# Patient Record
Sex: Male | Born: 1960 | Race: White | Hispanic: No | Marital: Married | Smoking: Never smoker
Health system: Southern US, Community
[De-identification: ages and names within clinical notes are randomized; demographics above are authoritative.]

## PROBLEM LIST (undated history)

## (undated) DIAGNOSIS — I240 Acute coronary thrombosis not resulting in myocardial infarction: Secondary | ICD-10-CM

## (undated) DIAGNOSIS — I10 Essential (primary) hypertension: Secondary | ICD-10-CM

## (undated) DIAGNOSIS — E119 Type 2 diabetes mellitus without complications: Secondary | ICD-10-CM

## (undated) DIAGNOSIS — G629 Polyneuropathy, unspecified: Secondary | ICD-10-CM

## (undated) DIAGNOSIS — F4310 Post-traumatic stress disorder, unspecified: Secondary | ICD-10-CM

## (undated) DIAGNOSIS — I639 Cerebral infarction, unspecified: Secondary | ICD-10-CM

## (undated) HISTORY — PX: SHOULDER SURGERY: SHX246

---

## 1998-08-06 ENCOUNTER — Encounter: Payer: Self-pay | Admitting: Emergency Medicine

## 1998-08-06 ENCOUNTER — Emergency Department (HOSPITAL_COMMUNITY): Admission: EM | Admit: 1998-08-06 | Discharge: 1998-08-06 | Payer: Self-pay | Admitting: Emergency Medicine

## 2006-03-03 ENCOUNTER — Emergency Department: Payer: Self-pay | Admitting: Emergency Medicine

## 2008-12-17 ENCOUNTER — Emergency Department: Payer: Self-pay

## 2009-12-01 ENCOUNTER — Emergency Department (HOSPITAL_COMMUNITY): Admission: EM | Admit: 2009-12-01 | Discharge: 2009-12-02 | Payer: Self-pay | Admitting: Emergency Medicine

## 2011-03-05 LAB — DIFFERENTIAL
Basophils Absolute: 0 10*3/uL (ref 0.0–0.1)
Eosinophils Relative: 0 % (ref 0–5)
Lymphocytes Relative: 5 % — ABNORMAL LOW (ref 12–46)
Lymphs Abs: 1 10*3/uL (ref 0.7–4.0)
Monocytes Absolute: 0.4 10*3/uL (ref 0.1–1.0)
Monocytes Relative: 2 % — ABNORMAL LOW (ref 3–12)
Neutro Abs: 17.4 10*3/uL — ABNORMAL HIGH (ref 1.7–7.7)

## 2011-03-05 LAB — RAPID URINE DRUG SCREEN, HOSP PERFORMED
Amphetamines: NOT DETECTED
Barbiturates: NOT DETECTED
Benzodiazepines: NOT DETECTED
Cocaine: POSITIVE — AB
Opiates: NOT DETECTED
Tetrahydrocannabinol: NOT DETECTED

## 2011-03-05 LAB — URINALYSIS, ROUTINE W REFLEX MICROSCOPIC
Bilirubin Urine: NEGATIVE
Glucose, UA: >1000 mg/dL — AB
Hgb urine dipstick: NEGATIVE
Leukocytes, UA: NEGATIVE
Nitrite: NEGATIVE
Protein, ur: NEGATIVE mg/dL
Specific Gravity, Urine: 1.016 (ref 1.005–1.030)
Urobilinogen, UA: 0.2 mg/dL (ref 0.0–1.0)
pH: 5.5 (ref 5.0–8.0)

## 2011-03-05 LAB — TRICYCLICS SCREEN, URINE: TCA Scrn: NOT DETECTED

## 2011-03-05 LAB — URINE MICROSCOPIC-ADD ON

## 2011-03-05 LAB — CBC
HCT: 38.6 % — ABNORMAL LOW (ref 39.0–52.0)
MCV: 86.8 fL (ref 78.0–100.0)
Platelets: 254 10*3/uL (ref 150–400)
RDW: 12.1 % (ref 11.5–15.5)
WBC: 18.9 10*3/uL — ABNORMAL HIGH (ref 4.0–10.5)

## 2011-03-05 LAB — COMPREHENSIVE METABOLIC PANEL
AST: 21 U/L (ref 0–37)
Albumin: 3.6 g/dL (ref 3.5–5.2)
BUN: 8 mg/dL (ref 6–23)
Calcium: 8 mg/dL — ABNORMAL LOW (ref 8.4–10.5)
Chloride: 105 mEq/L (ref 96–112)
Creatinine, Ser: 1.02 mg/dL (ref 0.4–1.5)
GFR calc Af Amer: >60 mL/min (ref >60)
Total Protein: 6.7 g/dL (ref 6.0–8.3)

## 2013-05-03 ENCOUNTER — Inpatient Hospital Stay: Payer: Self-pay | Admitting: Internal Medicine

## 2013-05-03 LAB — BASIC METABOLIC PANEL
BUN: 14 mg/dL (ref 7–18)
Chloride: 100 mmol/L (ref 98–107)
Co2: 27 mmol/L (ref 21–32)
EGFR (African American): >60
EGFR (Non-African Amer.): 59 — ABNORMAL LOW
Glucose: 224 mg/dL — ABNORMAL HIGH (ref 65–99)
Osmolality: 278 (ref 275–301)
Potassium: 3.8 mmol/L (ref 3.5–5.1)
Sodium: 135 mmol/L — ABNORMAL LOW (ref 136–145)

## 2013-05-03 LAB — CBC
MCH: 30 pg (ref 26.0–34.0)
MCHC: 34.9 g/dL (ref 32.0–36.0)
Platelet: 335 10*3/uL (ref 150–440)
RDW: 13.2 % (ref 11.5–14.5)

## 2013-05-03 LAB — TROPONIN I: Troponin-I: 0.04 ng/mL

## 2013-05-03 LAB — CK TOTAL AND CKMB (NOT AT ARMC): CK, Total: 305 U/L — ABNORMAL HIGH (ref 35–232)

## 2013-05-04 DIAGNOSIS — R079 Chest pain, unspecified: Secondary | ICD-10-CM

## 2013-05-04 DIAGNOSIS — I059 Rheumatic mitral valve disease, unspecified: Secondary | ICD-10-CM

## 2013-05-04 LAB — COMPREHENSIVE METABOLIC PANEL
BUN: 24 mg/dL — ABNORMAL HIGH (ref 7–18)
Bilirubin,Total: 0.4 mg/dL (ref 0.2–1.0)
Calcium, Total: 8.6 mg/dL (ref 8.5–10.1)
Chloride: 104 mmol/L (ref 98–107)
EGFR (Non-African Amer.): 40 — ABNORMAL LOW
Glucose: 191 mg/dL — ABNORMAL HIGH (ref 65–99)
Osmolality: 283 (ref 275–301)
SGOT(AST): 19 U/L (ref 15–37)
Sodium: 137 mmol/L (ref 136–145)
Total Protein: 7.1 g/dL (ref 6.4–8.2)

## 2013-05-04 LAB — URINALYSIS, COMPLETE
Bilirubin,UR: NEGATIVE
Ketone: NEGATIVE
Leukocyte Esterase: NEGATIVE
Ph: 5 (ref 4.5–8.0)
Protein: 100

## 2013-05-04 LAB — CBC WITH DIFFERENTIAL/PLATELET
Eosinophil #: 0.7 10*3/uL (ref 0.0–0.7)
HCT: 42.7 % (ref 40.0–52.0)
HGB: 15 g/dL (ref 13.0–18.0)
Lymphocyte %: 16.9 %
MCHC: 35 g/dL (ref 32.0–36.0)
MCV: 86 fL (ref 80–100)
Monocyte %: 5.1 %
Neutrophil #: 8.6 10*3/uL — ABNORMAL HIGH (ref 1.4–6.5)
Neutrophil %: 71.1 %

## 2013-05-04 LAB — CK TOTAL AND CKMB (NOT AT ARMC)
CK, Total: 261 U/L — ABNORMAL HIGH (ref 35–232)
CK-MB: 6.3 ng/mL — ABNORMAL HIGH (ref 0.5–3.6)
CK-MB: 6.4 ng/mL — ABNORMAL HIGH (ref 0.5–3.6)

## 2013-05-09 LAB — CULTURE, BLOOD (SINGLE)

## 2015-03-25 NOTE — Discharge Summary (Signed)
PATIENT NAME:  Alan Bryant, Alan Bryant MR#:  [id number] DATE OF BIRTH:  1960/12/13  DATE OF ADMISSION:  05/03/2013 DATE OF DISCHARGE:  05/04/2013  PR[facility]IC[facility]  [facility] [place].  CONSULTATIONS:  Cardiology consult by Dr. Mariah MillingGollan.   FINAL DIAGNOSES: 1.  Chest pain, borderline troponin that normalized history of coronary artery disease. 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  2.  Acute renal failure.  3.  Diabetes.  4.  Hypertension.  5.  Hyperlipidemia.   MEDICATIONS ON DISCHARGE: Include: Celexa 20 mg daily, nitroglycerin patch 0.1 mg/hour transdermal film extended-release one patch daily, Ambien 5 mg at bedtime, metoprolol tartrate 25 mg 1 tablet twice a day; do not take if using cocaine, atorvastatin 80 mg 1 tablet at bedtime, Haldol 1 mg at bedtime, morphine 30 mg per 12 hour extended-release 1 tablet every 12 hours, acetaminophen/ hydrocodone 325/5, 1 tablet every 6 hours as needed for pain. Stop lisinopril, stop her Glucophage.   DIET: Low sodium diet, regular consistency.   ACTIVITY: As tolerated.   FOLLOW-UP: With Dr. Mariah MillingGollan in 1 week. [facility] [place]. Recommend checking a BMP at follow-up appointment.   HOSPITAL COURSE: The patient was admitted with chest pain and elevated troponin.   HISTORY OF PRESENT ILLNESS: This is a 54 year old man with history of heart disease, cocaine abuse. He presented with left-sided chest pain radiating to the arm,  abusing cocaine all last night into the morning. He was found to have a slightly elevated troponin and CK-MB. White count was high. He was admitted to the hospital.   LABORATORY AND RADIOLOGICAL DATA DURING THE HOSPITAL COURSE: Included an EKG shows sinus tachycardia, left atrial enlargement.   CK 305, CK-MB 10.7, troponin borderline at 0.06, glucose 224, BUN 14, creatinine 1.37, sodium 135, potassium 3.8, chloride 100, CO2 of 27, calcium 9.7. A white blood cell count 23.7, hemoglobin and hematocrit 17.7 and  50.8, platelet count of 335.   Chest x-ray showed no acute cardiopulmonary disease.   Repeat EKG showed normal sinus rhythm, left atrial enlargement, nonspecific ST-T wave changes.   Blood cultures were negative. Oxygen saturation 92% on room air.   Urinalysis: 2+ blood, otherwise negative, greater than 500 mg/dL glucose. Creatinine elevated to 1.9 on June. White count down to 12.1.   Echocardiogram showed an ejection fraction of 55% to 60%, impaired relaxation pattern of left ventricular diastolic filling,   Repeat chest x-ray: No acute cardiopulmonary disease.   Next troponin negative at 0.04, next troponin negative at 0.04, next troponin negative at 0.02.   HOSPITAL COURSE PER PROBLEM LIST:  1.  For the patient's chest pain and borderline troponin that normalized, history of coronary artery disease this is likely cocaine induced vasospasm. The patient was seen in consultation by cardiology Dr. Mariah MillingGollan, who did not feel that any further testing was needed, but follow-up as outpatient as needed. I would advise the patient to continue his aspirin, metoprolol. Beta blocker is contraindicated if he is going to use cocaine and  I told him this cocaine. Cocaine can induce vasospasm and arrhythmia and death. The patient understands this. The patient has no thoughts of hurting himself or other people. I advised that he must stop cocaine at all costs will end up dead. The patient understands this. He is not interested in speaking with a psychiatrist at this time.  2.  Acute renal failure. The patient was given IV fluid hydration, lisinopril and Glucophage held. I advised him to  hold that until follow-up BMP in follow-up appointment.  3.  Diabetes. Will do diet control at this point.  4.  Hypertension. Beta blocker only if not using cocaine. Blood pressure 126/92 upon discharge.  5.  Hyperlipidemia. On statin.  6.  Chronic pain. He receives chronic pain medications. I did not prescribe anything further.     TIME SPENT ON DISCHARGE: 35 minutes.   ____________________________ Herschell Dimes. Renae Gloss, MD rjw:cc D: 05/04/2013 16:49:49 ET T: 05/04/2013 21:58:01 ET JOB#: [id number]  cc: Herschell Dimes. Renae Gloss, MD, <Dictator> [facility] Antonieta Iba, MD  Salley Scarlet MD ELECTRONICALLY SIGNED 05/06/2013 15:18

## 2015-03-25 NOTE — H&P (Signed)
PATIENT NAME:  Alan Bryant, Alan Bryant MR#:  [id number] DATE OF BIRTH:  1961/02/22  DATE OF ADMISSION:  05/03/2013  REFERRING PHYSICIAN: Dr. Mindi JunkerGottlieb.   [place]   REASON FOR ADMISSION: Chest pain with elevated troponin.   HISTORY OF PRESENT ILLNESS: The patient is a 54 year old male with a history of 3-vessel coronary artery disease by cardiac catheterization in September of 2013. Has a history of diabetes and cocaine abuse. Presents to the Emergency Room today with a 2-hour history of left-sided chest pain radiating to the arm. Has been abusing cocaine all last night into this morning. In the Emergency Room, the patient was noted to have an elevated troponin with CK and MB elevation as well. His white count was very high. He was started on topical nitrates and is now admitted for further evaluation.   PAST MEDICAL HISTORY:  1. Type 2 diabetes mellitus.  2. Three-vessel coronary artery disease being medically managed.  3. Cocaine abuse.  4. Benign hypertension.  5. Anxiety/depression.   MEDICATIONS:  1. Metformin 500 mg p.o. b.i.d.  2. Zestril 10 mg p.o. daily.  3. Haldol 0.5 mg p.o. q.i.d.  4. Celexa 20 mg p.o. daily.  5. Ambien 5 mg p.o. at bedtime.   ALLERGIES: No known drug allergies.   SOCIAL HISTORY: The patient has a long-standing history of cocaine abuse. Denies alcohol abuse. Denies tobacco abuse.   FAMILY HISTORY: Positive for coronary artery disease and diabetes.   REVIEW OF SYSTEMS:   CONSTITUTIONAL: No fever or change in weight.  EYES: No blurred or double vision. No glaucoma.  ENT: No tinnitus or hearing loss. No nasal discharge or bleeding. No difficulty swallowing.  RESPIRATORY: No cough or wheezing. Denies hemoptysis. No painful respiration.  CARDIOVASCULAR: No orthopnea or palpitations. No syncope.  GASTROINTESTINAL: No nausea, vomiting, or diarrhea. No abdominal pain. No change in bowel habits.  GENITOURINARY: No dysuria or hematuria. No  incontinence.  ENDOCRINE: No polyuria or polydipsia. No heat or cold intolerance.  HEMATOLOGIC: The patient denies anemia, easy bruising, or bleeding.  LYMPHATIC: No swollen glands.  MUSCULOSKELETAL: The patient denies pain in his neck, back, shoulders, knees, or hips. No gout.  NEUROLOGIC: No numbness or weakness. Denies migraines, stroke, or seizures.  PSYCHIATRIC: The patient denies anxiety, insomnia, or depression.   PHYSICAL EXAMINATION:  GENERAL: The patient is somewhat lethargic but in no acute distress.  VITAL SIGNS: Remarkable for a blood pressure of 130/93, heart rate of 82 and a respiratory rate of 27. Temperature is 98.5. Sat is 98% on room air.  HEENT: Normocephalic, atraumatic. Pupils equally round, reactive to light and accommodation. Extraocular movements are intact. Sclerae are nonicteric. Conjunctivae are clear. Oropharynx is clear.  NECK: Supple without JVD. No adenopathy or thyromegaly.  LUNGS: Basilar rhonchi but are otherwise clear. No wheezes or rales. No dullness. Respiratory effort is normal.  CARDIAC: Regular rate and rhythm with normal S1, S2. No significant rubs, murmurs, or gallops. PMI is nondisplaced. Chest wall is nontender.  ABDOMEN: Soft, nontender, with normoactive bowel sounds. No organomegaly or masses were appreciated. No hernias or bruits were noted.  EXTREMITIES: Without clubbing, cyanosis, or edema. Pulses were 2+ bilaterally.  SKIN: Warm and dry without rash or lesions.  NEUROLOGIC: Cranial nerves II through XII grossly intact. Deep tendon reflexes were symmetric. Motor and sensory exams nonfocal.  PSYCHIATRIC: Revealed a patient that was lethargic but would respond to voice and did answer questions appropriately.   LABORATORY DATA: EKG revealed sinus rhythm with no  acute ischemic changes. Portable chest x-ray was unremarkable. White count was 23.7 with a hemoglobin of 17.3. Glucose was 224 with a BUN of 14, creatinine of 1.37, with a sodium of 135 and a  GFR of 59. Total CK was 305 with an MB of 10.7. Troponin was 0.06.   ASSESSMENT:  1. Chest pain worrisome for unstable angina.  2. Elevated troponin.  3. Cocaine abuse.  4. Three vessel coronary artery disease being medically managed.  5. Type 2 diabetes mellitus.  6. Hyponatremia.  7. Mild renal insufficiency.  8. Presumed dehydration.   PLAN: The patient will be admitted to the intensive care unit as a FULL CODE. He will be started on aspirin, Lovenox, topical nitrates, and beta blocker therapy. Will follow his sugars with Accu-Cheks before meals and at bedtime and add sliding scale insulin as needed. Will follow serial cardiac enzymes and obtain an echocardiogram. Will consult cardiology because of the patient's chest pain and elevated troponin. Will obtain a psychiatry consult because of the patient's substance/cocaine abuse. Will empirically supplement oxygen at this time and wean as tolerated. Because of his leukocytosis, will send off a urine and blood cultures. Repeat chest x-ray in the morning. Begin empiric IV antibiotics. Further treatment and evaluation will depend upon the patient's progress.   TOTAL TIME SPENT WITH THIS PATIENT: 50 minutes.   ____________________________ Duane Lope Judithann Sheen, MD jds:gb D: 05/03/2013 21:44:08 ET T: 05/03/2013 22:34:38 ET JOB#: [id number]  cc: Duane Lope. Judithann Sheen, MD, <Dictator> Tremar Wickens Rodena Medin MD ELECTRONICALLY SIGNED 05/04/2013 7:51

## 2015-03-25 NOTE — Consult Note (Signed)
General Aspect 54 year old male with a history of 3-vessel coronary artery disease by cardiac catheterization in September of 2013, PTSD, prior use of cocaine, diabetes, presents to the Emergency Room  with a 2-hour history of left-sided chest pain radiating to the arm. Cardiology was consulted for chest pain/angina.  He reported that he Had been abusing cocaine the night prior to admission, overnight. He has been battling with PTSD. Prior to yesterday, he den ied any chest pain.   In the Emergency Room, the patient was noted to have an elevated troponin with CK and MB elevation as well. His white count was very high. He was started on topical nitrates. Overnight, he has been chest pain free.   Present Illness .  SOCIAL HISTORY: The patient has a long-standing history of cocaine abuse. Denies alcohol abuse. Denies tobacco abuse.  FAMILY HISTORY: Positive for coronary artery disease and diabetes.   Physical Exam:  GEN well developed, well nourished, no acute distress   HEENT hearing intact to voice, moist oral mucosa   NECK supple   RESP normal resp effort  clear BS   CARD Regular rate and rhythm   ABD denies tenderness   LYMPH negative neck   EXTR negative edema   SKIN normal to palpation   NEURO motor/sensory function intact   PSYCH alert, A+O to time, place, person, good insight   Review of Systems:  Subjective/Chief Complaint chest pain, resolved   General: No Complaints   Skin: No Complaints   ENT: No Complaints   Eyes: No Complaints   Neck: No Complaints   Respiratory: No Complaints   Cardiovascular: Chest pain or discomfort   Gastrointestinal: No Complaints   Genitourinary: No Complaints   Vascular: No Complaints   Musculoskeletal: No Complaints   Neurologic: No Complaints   Hematologic: No Complaints   Endocrine: No Complaints   Psychiatric: No Complaints   Review of Systems: All other systems were reviewed and found to be negative    Medications/Allergies Reviewed Medications/Allergies reviewed     NIDDM:    denies:        Admit Diagnosis:   786.50 CHEST PAIN 790.6 ELEVATED TROPONI: Onset Date: 04-May-2013, Status: Active, Description: 786.50 CHEST PAIN 790.6 ELEVATED TROPONI  Home Medications: Medication Instructions Status  Celexa 20 mg oral tablet 1  orally   Active  metFORMIN 500 mg oral tablet 1 tab(s) orally 2 times a day Active  lisinopril 10 mg oral tablet 1 tab(s) orally once a day Active  Nitro TD Patch-A 0.1 mg/hr transdermal film, extended release 1 patch transdermal once a day Active  haloperidol 0.5 mg oral tablet 1 tab(s) orally 4 times a day Active  morphine  orally  Active  Ambien 5 mg oral tablet 1 tab(s) orally once a day (at bedtime) Active   Lab Results:  Hepatic:  02-Jun-14 06:16   Bilirubin, Total 0.4  Alkaline Phosphatase 117  SGPT (ALT) 22  SGOT (AST) 19  Total Protein, Serum 7.1  Albumin, Serum  3.3  Routine Chem:  02-Jun-14 06:16   Glucose, Serum  191  BUN  24  Creatinine (comp)  1.90  Sodium, Serum 137  Potassium, Serum 3.5  Chloride, Serum 104  CO2, Serum 26  Calcium (Total), Serum 8.6  Osmolality (calc) 283  eGFR (African American)  46  eGFR (Non-African American)  40 (eGFR values <25m/min/1.73 m2 may be an indication of chronic kidney disease (CKD). Calculated eGFR is useful in patients with stable renal function. The eGFR calculation  will not be reliable in acutely ill patients when serum creatinine is changing rapidly. It is not useful in  patients on dialysis. The eGFR calculation may not be applicable to patients at the low and high extremes of body sizes, pregnant women, and vegetarians.)  Anion Gap 7  Cardiac:  01-Jun-14 09:28   Troponin I  0.06 (0.00-0.05 0.05 ng/mL or less: NEGATIVE  Repeat testing in 3-6 hrs  if clinically indicated. >0.05 ng/mL: POTENTIAL  MYOCARDIAL INJURY. Repeat  testing in 3-6 hrs if  clinically indicated. NOTE: An increase  or decrease  of 30% or more on serial  testing suggests a  clinically important change)    21:37   Troponin I 0.04 (0.00-0.05 0.05 ng/mL or less: NEGATIVE  Repeat testing in 3-6 hrs  if clinically indicated. >0.05 ng/mL: POTENTIAL  MYOCARDIAL INJURY. Repeat  testing in 3-6 hrs if  clinically indicated. NOTE: An increase or decrease  of 30% or more on serial  testing suggests a  clinically important change)  02-Jun-14 06:16   CK, Total  241  CPK-MB, Serum  6.3 (Result(s) reported on 04 May 2013 at 06:45AM.)  Troponin I 0.04 (0.00-0.05 0.05 ng/mL or less: NEGATIVE  Repeat testing in 3-6 hrs  if clinically indicated. >0.05 ng/mL: POTENTIAL  MYOCARDIAL INJURY. Repeat  testing in 3-6 hrs if  clinically indicated. NOTE: An increase or decrease  of 30% or more on serial  testing suggests a  clinically important change)  Routine Hem:  02-Jun-14 06:16   WBC (CBC)  12.1  RBC (CBC) 4.99  Hemoglobin (CBC) 15.0  Hematocrit (CBC) 42.7  Platelet Count (CBC) 255  MCV 86  MCH 30.0  MCHC 35.0  RDW 12.9  Neutrophil % 71.1  Lymphocyte % 16.9  Monocyte % 5.1  Eosinophil % 5.8  Basophil % 1.1  Neutrophil #  8.6  Lymphocyte # 2.0  Monocyte # 0.6  Eosinophil # 0.7  Basophil # 0.1 (Result(s) reported on 04 May 2013 at 06:39AM.)   EKG:  Interpretation EKG shows NSR with ST andd T wave abn in V4 to V6, I and AVL ST and T wave abn improved in earlier EKGs   Radiology Results: XRay:    01-Jun-14 09:38, Chest Portable Single View  Chest Portable Single View   REASON FOR EXAM:    Chest Pain  COMMENTS:       PROCEDURE: DXR - DXR PORTABLE CHEST SINGLE VIEW  - May 03 2013  9:38AM     RESULT: The lungs are clear. The heart and pulmonary vessels are normal.   The bony and mediastinal structures are unremarkable. There is no   effusion. There is no pneumothorax or evidence of congestive failure.    IMPRESSION:  No acute cardiopulmonary disease.    Dictation Site:  6        Verified By: Sundra Aland, M.D., MD    No Known Allergies:   Vital Signs/Nurse's Notes: **Vital Signs.:   02-Jun-14 07:00  Vital Signs Type Routine  Temperature Temperature (F) 96.5  Celsius 35.8  Temperature Source axillary  Pulse Pulse 66  Respirations Respirations 25  Systolic BP Systolic BP 161  Diastolic BP (mmHg) Diastolic BP (mmHg) 80  Mean BP 92  Pulse Ox % Pulse Ox % 95  Pulse Ox Activity Level  At rest  Oxygen Delivery Room Air/ 21 %  Pulse Ox Heart Rate 60    Impression 54 year old male with a history of 3-vessel coronary artery disease by cardiac  catheterization in September of 2013, PTSD, prior use of cocaine, diabetes, presents to the Emergency Room  with a 2-hour history of left-sided chest pain radiating to the arm. Cardiology was consulted for chest pain/angina.  1) Chest pain Known CAD (details unavailable), in setting of cocaine use Elevated CK and CKMB, troponin little changed. EKG changes in V4 to V6, I and AVL (change from early EKG) Currently pain free. --will [facility] --Monitor third set of c ardiac enz Medical management for now --If CAD appears amenable to PCI based on report, could repeat cath this afternoon (will hold lunch while waiting for report) -Continue statin, asa, hold lovenox, hold b-blocker NTG paste ok, NTG sl for chest pain  2)cocaine discussed with him He has used before. he is aware of dangers PTSD contributing  3) Diabetes: management per medical service contributing to CAD  4) Hyperlipidemia continue statin  5)PTSD: in some sort of treatment at the [place] he reports he was told to "start smoking"   Electronic Signatures: Ida Rogue (MD)  (Signed 02-Jun-14 08:25)  Authored: General Aspect/Present Illness, History and Physical Exam, Review of System, Past Medical History, Health Issues, Home Medications, Labs, EKG , Radiology, Allergies, Vital Signs/Nurse's Notes,  Impression/Plan   Last Updated: 02-Jun-14 08:25 by Ida Rogue (MD)

## 2015-04-25 ENCOUNTER — Emergency Department: Payer: Non-veteran care

## 2015-04-25 ENCOUNTER — Emergency Department
Admission: EM | Admit: 2015-04-25 | Discharge: 2015-04-26 | Disposition: A | Discharge status: Home / Self Care | Payer: Non-veteran care | Attending: Emergency Medicine | Admitting: Emergency Medicine

## 2015-04-25 ENCOUNTER — Encounter: Payer: Self-pay | Admitting: Emergency Medicine

## 2015-04-25 ENCOUNTER — Other Ambulatory Visit: Payer: Self-pay

## 2015-04-25 DIAGNOSIS — S3992XA Unspecified injury of lower back, initial encounter: Secondary | ICD-10-CM | POA: Insufficient documentation

## 2015-04-25 DIAGNOSIS — E119 Type 2 diabetes mellitus without complications: Secondary | ICD-10-CM | POA: Insufficient documentation

## 2015-04-25 DIAGNOSIS — S299XXA Unspecified injury of thorax, initial encounter: Secondary | ICD-10-CM | POA: Insufficient documentation

## 2015-04-25 DIAGNOSIS — Y998 Other external cause status: Secondary | ICD-10-CM | POA: Insufficient documentation

## 2015-04-25 DIAGNOSIS — S3991XA Unspecified injury of abdomen, initial encounter: Secondary | ICD-10-CM | POA: Diagnosis not present

## 2015-04-25 DIAGNOSIS — S8992XA Unspecified injury of left lower leg, initial encounter: Secondary | ICD-10-CM | POA: Insufficient documentation

## 2015-04-25 DIAGNOSIS — Y9389 Activity, other specified: Secondary | ICD-10-CM | POA: Diagnosis not present

## 2015-04-25 DIAGNOSIS — M25561 Pain in right knee: Secondary | ICD-10-CM

## 2015-04-25 DIAGNOSIS — S199XXA Unspecified injury of neck, initial encounter: Secondary | ICD-10-CM | POA: Insufficient documentation

## 2015-04-25 DIAGNOSIS — S8991XA Unspecified injury of right lower leg, initial encounter: Secondary | ICD-10-CM | POA: Insufficient documentation

## 2015-04-25 DIAGNOSIS — Y92410 Unspecified street and highway as the place of occurrence of the external cause: Secondary | ICD-10-CM | POA: Diagnosis not present

## 2015-04-25 DIAGNOSIS — R202 Paresthesia of skin: Secondary | ICD-10-CM | POA: Diagnosis not present

## 2015-04-25 DIAGNOSIS — I10 Essential (primary) hypertension: Secondary | ICD-10-CM | POA: Diagnosis not present

## 2015-04-25 DIAGNOSIS — M25562 Pain in left knee: Secondary | ICD-10-CM

## 2015-04-25 DIAGNOSIS — M549 Dorsalgia, unspecified: Secondary | ICD-10-CM

## 2015-04-25 HISTORY — DX: Acute coronary thrombosis not resulting in myocardial infarction: I24.0

## 2015-04-25 HISTORY — DX: Post-traumatic stress disorder, unspecified: F43.10

## 2015-04-25 HISTORY — DX: Essential (primary) hypertension: I10

## 2015-04-25 HISTORY — DX: Type 2 diabetes mellitus without complications: E11.9

## 2015-04-25 HISTORY — DX: Cerebral infarction, unspecified: I63.9

## 2015-04-25 LAB — CBC WITH DIFFERENTIAL/PLATELET
Basophils Absolute: 0.1 10*3/uL (ref 0–0.1)
Basophils Relative: 1 %
EOS PCT: 7 %
Eosinophils Absolute: 0.6 10*3/uL (ref 0–0.7)
HCT: 39 % — ABNORMAL LOW (ref 40.0–52.0)
HEMOGLOBIN: 13.1 g/dL (ref 13.0–18.0)
Lymphocytes Relative: 44 %
Lymphs Abs: 4.4 10*3/uL — ABNORMAL HIGH (ref 1.0–3.6)
MCH: 29 pg (ref 26.0–34.0)
MCHC: 33.7 g/dL (ref 32.0–36.0)
MCV: 85.9 fL (ref 80.0–100.0)
MONO ABS: 0.5 10*3/uL (ref 0.2–1.0)
MONOS PCT: 5 %
NEUTROS ABS: 4.3 10*3/uL (ref 1.4–6.5)
Neutrophils Relative %: 43 %
PLATELETS: 274 10*3/uL (ref 150–440)
RBC: 4.54 MIL/uL (ref 4.40–5.90)
RDW: 12.9 % (ref 11.5–14.5)
WBC: 9.9 10*3/uL (ref 3.8–10.6)

## 2015-04-25 LAB — COMPREHENSIVE METABOLIC PANEL
ALBUMIN: 4.1 g/dL (ref 3.5–5.0)
ALT: 29 U/L (ref 17–63)
AST: 28 U/L (ref 15–41)
Alkaline Phosphatase: 85 U/L (ref 38–126)
Anion gap: 8 (ref 5–15)
BILIRUBIN TOTAL: 0.3 mg/dL (ref 0.3–1.2)
BUN: 17 mg/dL (ref 6–20)
CHLORIDE: 103 mmol/L (ref 101–111)
CO2: 24 mmol/L (ref 22–32)
CREATININE: 1.52 mg/dL — AB (ref 0.61–1.24)
Calcium: 9.2 mg/dL (ref 8.9–10.3)
GFR calc Af Amer: 59 mL/min — ABNORMAL LOW (ref >60)
GFR calc non Af Amer: 51 mL/min — ABNORMAL LOW (ref >60)
GLUCOSE: 230 mg/dL — AB (ref 65–99)
POTASSIUM: 3.9 mmol/L (ref 3.5–5.1)
Sodium: 135 mmol/L (ref 135–145)
Total Protein: 7.5 g/dL (ref 6.5–8.1)

## 2015-04-25 LAB — TROPONIN I

## 2015-04-25 MED ORDER — ONDANSETRON HCL 4 MG/2ML IJ SOLN
INTRAMUSCULAR | Status: AC
  Filled 2015-04-25: qty 2

## 2015-04-25 MED ORDER — SODIUM CHLORIDE 0.9 % IV SOLN
Freq: Once | INTRAVENOUS | Status: AC
  Administered 2015-04-25: 22:00:00 via INTRAVENOUS

## 2015-04-25 MED ORDER — ONDANSETRON HCL 4 MG/2ML IJ SOLN
4.0000 mg | Freq: Once | INTRAMUSCULAR | Status: AC
  Administered 2015-04-25: 4 mg via INTRAVENOUS

## 2015-04-25 MED ORDER — HYDROMORPHONE HCL 1 MG/ML IJ SOLN
INTRAMUSCULAR | Status: AC
  Filled 2015-04-25: qty 1

## 2015-04-25 MED ORDER — HYDROMORPHONE HCL 1 MG/ML IJ SOLN
1.0000 mg | Freq: Once | INTRAMUSCULAR | Status: AC
  Administered 2015-04-25: 1 mg via INTRAVENOUS

## 2015-04-25 MED ORDER — IOHEXOL 300 MG/ML  SOLN
100.0000 mL | Freq: Once | INTRAMUSCULAR | Status: AC | PRN
  Administered 2015-04-25: 100 mL via INTRAVENOUS
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
## 2015-04-26 LAB — URINALYSIS COMPLETE WITH MICROSCOPIC (ARMC ONLY)
Bacteria, UA: NONE SEEN
Bilirubin Urine: NEGATIVE
Hgb urine dipstick: NEGATIVE
Ketones, ur: NEGATIVE mg/dL
LEUKOCYTES UA: NEGATIVE
NITRITE: NEGATIVE
PROTEIN: NEGATIVE mg/dL
Specific Gravity, Urine: 1.026 (ref 1.005–1.030)
pH: 5 (ref 5.0–8.0)

## 2015-04-26 MED ORDER — HYDROMORPHONE HCL 1 MG/ML IJ SOLN
INTRAMUSCULAR | Status: AC
  Administered 2015-04-26: 1 mg via INTRAVENOUS
  Filled 2015-04-26: qty 1

## 2015-04-26 MED ORDER — HYDROMORPHONE HCL 1 MG/ML IJ SOLN
1.0000 mg | Freq: Once | INTRAMUSCULAR | Status: AC
  Administered 2015-04-26: 1 mg via INTRAVENOUS

## 2015-04-26 MED ORDER — OXYCODONE HCL 10 MG PO TABS: 10.0000 mg | ORAL_TABLET | Freq: Four times a day (QID) | ORAL | Status: DC | PRN
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
## 2016-04-01 ENCOUNTER — Emergency Department (HOSPITAL_COMMUNITY): Payer: Non-veteran care

## 2016-04-01 ENCOUNTER — Emergency Department (HOSPITAL_COMMUNITY)
Admission: EM | Admit: 2016-04-01 | Discharge: 2016-04-01 | Disposition: A | Discharge status: Home / Self Care | Payer: Non-veteran care | Attending: Emergency Medicine | Admitting: Emergency Medicine

## 2016-04-01 ENCOUNTER — Encounter (HOSPITAL_COMMUNITY): Payer: Self-pay | Admitting: Emergency Medicine

## 2016-04-01 DIAGNOSIS — Z8659 Personal history of other mental and behavioral disorders: Secondary | ICD-10-CM | POA: Insufficient documentation

## 2016-04-01 DIAGNOSIS — Z8673 Personal history of transient ischemic attack (TIA), and cerebral infarction without residual deficits: Secondary | ICD-10-CM | POA: Insufficient documentation

## 2016-04-01 DIAGNOSIS — S9031XA Contusion of right foot, initial encounter: Secondary | ICD-10-CM | POA: Insufficient documentation

## 2016-04-01 DIAGNOSIS — S90121A Contusion of right lesser toe(s) without damage to nail, initial encounter: Secondary | ICD-10-CM

## 2016-04-01 DIAGNOSIS — Y9289 Other specified places as the place of occurrence of the external cause: Secondary | ICD-10-CM | POA: Insufficient documentation

## 2016-04-01 DIAGNOSIS — W2209XA Striking against other stationary object, initial encounter: Secondary | ICD-10-CM | POA: Insufficient documentation

## 2016-04-01 DIAGNOSIS — Y9389 Activity, other specified: Secondary | ICD-10-CM | POA: Insufficient documentation

## 2016-04-01 DIAGNOSIS — E119 Type 2 diabetes mellitus without complications: Secondary | ICD-10-CM | POA: Insufficient documentation

## 2016-04-01 DIAGNOSIS — S91201A Unspecified open wound of right great toe with damage to nail, initial encounter: Secondary | ICD-10-CM | POA: Insufficient documentation

## 2016-04-01 DIAGNOSIS — I10 Essential (primary) hypertension: Secondary | ICD-10-CM | POA: Insufficient documentation

## 2016-04-01 DIAGNOSIS — S91209A Unspecified open wound of unspecified toe(s) with damage to nail, initial encounter: Secondary | ICD-10-CM

## 2016-04-01 DIAGNOSIS — Y998 Other external cause status: Secondary | ICD-10-CM | POA: Insufficient documentation

## 2016-04-01 LAB — BASIC METABOLIC PANEL
Anion gap: 11 (ref 5–15)
BUN: 24 mg/dL — ABNORMAL HIGH (ref 6–20)
CALCIUM: 8.9 mg/dL (ref 8.9–10.3)
CO2: 20 mmol/L — AB (ref 22–32)
Chloride: 100 mmol/L — ABNORMAL LOW (ref 101–111)
Creatinine, Ser: 2.04 mg/dL — ABNORMAL HIGH (ref 0.61–1.24)
GFR calc Af Amer: 41 mL/min — ABNORMAL LOW (ref >60)
GFR calc non Af Amer: 35 mL/min — ABNORMAL LOW (ref >60)
GLUCOSE: 464 mg/dL — AB (ref 65–99)
Potassium: 4 mmol/L (ref 3.5–5.1)
Sodium: 131 mmol/L — ABNORMAL LOW (ref 135–145)

## 2016-04-01 LAB — CBC WITH DIFFERENTIAL/PLATELET
BASOS ABS: 0.1 10*3/uL (ref 0.0–0.1)
BASOS PCT: 1 %
EOS PCT: 4 %
Eosinophils Absolute: 0.4 10*3/uL (ref 0.0–0.7)
HCT: 42.8 % (ref 39.0–52.0)
Hemoglobin: 14.9 g/dL (ref 13.0–17.0)
LYMPHS PCT: 44 %
Lymphs Abs: 4.1 10*3/uL — ABNORMAL HIGH (ref 0.7–4.0)
MCH: 29.8 pg (ref 26.0–34.0)
MCHC: 34.8 g/dL (ref 30.0–36.0)
MCV: 85.6 fL (ref 78.0–100.0)
MONO ABS: 0.5 10*3/uL (ref 0.1–1.0)
Monocytes Relative: 6 %
Neutro Abs: 4.3 10*3/uL (ref 1.7–7.7)
Neutrophils Relative %: 45 %
Platelets: 218 10*3/uL (ref 150–400)
RBC: 5 MIL/uL (ref 4.22–5.81)
RDW: 12.6 % (ref 11.5–15.5)
WBC: 9.5 10*3/uL (ref 4.0–10.5)

## 2016-04-01 LAB — CBG MONITORING, ED: GLUCOSE-CAPILLARY: 428 mg/dL — AB (ref 65–99)

## 2016-04-01 MED ORDER — HYDROCODONE-ACETAMINOPHEN 5-325 MG PO TABS: 1.0000 | ORAL_TABLET | Freq: Four times a day (QID) | ORAL | Status: DC | PRN

## 2016-04-01 MED ORDER — HYDROCODONE-ACETAMINOPHEN 5-325 MG PO TABS
ORAL_TABLET | ORAL | Status: AC
  Filled 2016-04-01: qty 1

## 2016-04-01 MED ORDER — HYDROCODONE-ACETAMINOPHEN 5-325 MG PO TABS
1.0000 | ORAL_TABLET | Freq: Once | ORAL | Status: AC
Start: 2016-04-01 — End: 2016-04-01
  Administered 2016-04-01: 1 via ORAL

## 2016-04-01 MED ORDER — BACITRACIN ZINC 500 UNIT/GM EX OINT
1.0000 "application " | TOPICAL_OINTMENT | Freq: Two times a day (BID) | CUTANEOUS | Status: DC
  Administered 2016-04-01: 1 via TOPICAL

## 2016-04-01 MED ORDER — HYDROCODONE-ACETAMINOPHEN 5-325 MG PO TABS
1.0000 | ORAL_TABLET | Freq: Once | ORAL | Status: AC
  Administered 2016-04-01: 1 via ORAL
  Filled 2016-04-01: qty 1

## 2016-04-01 NOTE — ED Notes (Signed)
While putting sock back on, patient said toenail got caught on something in sock.  Patient then pulled toenail of right great toe off.

## 2016-04-01 NOTE — ED Notes (Signed)
Soaking right great toe in NS per PA verbal order.

## 2016-04-01 NOTE — Discharge Instructions (Signed)
Foot Contusion A foot contusion is a deep bruise to the foot. Contusions are the result of an injury that caused bleeding under the skin. The contusion may turn blue, purple, or yellow. Minor injuries will give you a painless contusion, but more severe contusions may stay painful and swollen for a few weeks. CAUSES  A foot contusion comes from a direct blow to that area, such as a heavy object falling on the foot. SYMPTOMS   Swelling of the foot.  Discoloration of the foot.  Tenderness or soreness of the foot. DIAGNOSIS  You will have a physical exam and will be asked about your history. You may need an X-ray of your foot to look for a broken bone (fracture).  TREATMENT  An elastic wrap may be recommended to support your foot. Resting, elevating, and applying cold compresses to your foot are often the best treatments for a foot contusion. Over-the-counter medicines may also be recommended for pain control. HOME CARE INSTRUCTIONS   Put ice on the injured area.  Put ice in a plastic bag.  Place a towel between your skin and the bag.  Leave the ice on for 15-20 minutes, 03-04 times a day.  Only take over-the-counter or prescription medicines for pain, discomfort, or fever as directed by your caregiver.  If told, use an elastic wrap as directed. This can help reduce swelling. You may remove the wrap for sleeping, showering, and bathing. If your toes become numb, cold, or blue, take the wrap off and reapply it more loosely.  Elevate your foot with pillows to reduce swelling.  Try to avoid standing or walking while the foot is painful. Do not resume use until instructed by your caregiver. Then, begin use gradually. If pain develops, decrease use. Gradually increase activities that do not cause discomfort until you have normal use of your foot.  See your caregiver as directed. It is very important to keep all follow-up appointments in order to avoid any lasting problems with your foot,  including long-term (chronic) pain. SEEK IMMEDIATE MEDICAL CARE IF:   You have increased redness, swelling, or pain in your foot.  Your swelling or pain is not relieved with medicines.  You have loss of feeling in your foot or are unable to move your toes.  Your foot turns cold or blue.  You have pain when you move your toes.  Your foot becomes warm to the touch.  Your contusion does not improve in 2 days. MAKE SURE YOU:   Understand these instructions.  Will watch your condition.  Will get help right away if you are not doing well or get worse.   This information is not intended to replace advice given to you by your health care provider. Make sure you discuss any questions you have with your health care provider.   Document Released: 09/10/2006 Document Revised: 05/20/2012 Document Reviewed: 07/26/2015 Elsevier Interactive Patient Education 2016 [employer] Nail Avulsion Injury Nail avulsion means that you have lost the whole, or part of a nail. The nail will usually grow back in 2 to 6 months. If your injury damaged the growth center of the nail, the nail may be deformed, split, or not stuck to the nail bed. Sometimes the avulsed nail is stitched back in place. This provides temporary protection to the nail bed until the new nail grows in.  HOME CARE INSTRUCTIONS   Raise (elevate) your injury as much as possible.  Protect the injury and cover it with bandages (dressings) or  splints as instructed.  Change dressings as instructed. SEEK MEDICAL CARE IF:   There is increasing pain, redness, or swelling.  You cannot move your fingers or toes.   This information is not intended to replace advice given to you by your health care provider. Make sure you discuss any questions you have with your health care provider.   Document Released: 12/27/2004 Document Revised: 02/11/2012 Document Reviewed: 10/21/2009 Elsevier Interactive Patient Education 2016 Else[employer] Inc[employer] Nail Bed  Injury The nail bed is the soft tissue under a fingernail or toenail that is the origin for new nail growth. Various types of injuries can occur at the nail bed. These injuries may involve bruising or bleeding under the nail, cuts (lacerations) in the nail or nail bed, or loss of a part of the nail or the whole nail (avulsion). In some cases, a nail bed injury accompanies another injury, such as a break (fracture) of the bone at the tip of the finger or toe. Nail bed injuries are common in people who have jobs that require performing manual tasks with their hands, such as carpenters and landscapers.  The nail bed includes the growth center of the nail. If this growth center is damaged, the injured nail may not grow back normally if at all. The regrown nail might have an abnormal shape or appearance. It can take several months for a damaged or torn-off nail to regrow. Depending on the nature and extent of the nail bed injury, there may be a permanent disruption of normal nail growth. CAUSES  Damage to the nail bed area is usually caused by crushing, pinching, cutting, or tearing injuries of the fingertip or toe. For example, these injuries may occur when a fingertip gets caught in a door, hit by a hammer, or damaged in accidents involving electrical tools or power machinery.  SYMPTOMS  Symptoms vary depending on the nature of the injury. Symptoms may include:  Pain in the injured area.  Bleeding.  Swelling.  Discoloration.  Collection of blood under the nail (hematoma).  Deformed or split nail.  Loose nail (not stuck to the nail bed).  Loss of all or part of the nail. DIAGNOSIS  Your caregiver will take a medical history and examine the injured area. You will be asked to describe how the injury occurred. X-rays may be done to see if you have a fracture. Your caregiver might also check for conditions that may affect healing, such as diabetes, nerve problems, or poor circulation.  TREATMENT    Treatment depends on the type of injury.  The injury may not require any special treatment other than keeping the area clean and free of infection.   Your caregiver may drain the collection of blood from under the nail. This can be done by making a small hole in the nail.   Your caregiver may remove all or part of your nail. This might be necessary to stitch (suture) any laceration in the nail bed. Before doing this, the caregiver will likely give you medication to numb the nail area (local anesthetic). In some cases, the caregiver may choose to numb the entire finger or toe (digital nerve block). Depending on the location and size of the nail bed injury, an avulsed nail is sometimes stitched back in place to provide temporary protection to the nail bed until the new nail grows in.  Your caregiver may apply bandages (dressings) or splints to the area.  You might be prescribed antibiotic medication to help prevent infection.  For certain injuries, your caregiver may direct you to see a hand or foot specialist.  You may need a tetanus shot if:  You cannot remember when you had your last tetanus shot.  You have never had a tetanus shot.  The injury broke your skin. If you get a tetanus shot, your arm may swell, get red, and feel warm to the touch. This is common and not a problem. If you need a tetanus shot and you choose not to have one, there is a rare chance of getting tetanus. Sickness from tetanus can be serious. HOME CARE INSTRUCTIONS   Keep your hand or foot raised (elevated) to relieve pain and swelling.   For an injured toenail, lie in bed or on a couch with your leg on pillows. You can also sit in a recliner with your leg up. Avoid walking or letting your leg dangle. When you walk, wear an open-toe shoe.  For an injured fingernail, keep your hand above the level of your heart. Use pillows on a table or on the arm of your chair while sitting. Use them on your bed while  sleeping.   Keep your injury protected with dressings or splints as directed by your caregiver.   Keep any dressings clean and dry. Change or remove your dressings as directed by your caregiver.   Only take over-the-counter or prescription medications as directed by your caregiver. If you were prescribed antibiotics, take them as directed. Finish them even if you start to feel better.   Follow up with your caregiver as directed.  SEEK MEDICAL CARE IF:   You have pain that is not controlled with medication.   You have any problems caring for your injury.  SEEK IMMEDIATE MEDICAL CARE IF:   You have increased pain, drainage, or bleeding in the injured area.   You have redness, soreness, and swelling (inflammation) in the injured area.  You have a fever or persistent symptoms for more than 2-3 days.  You have a fever and your symptoms suddenly get worse.  You have swelling that spreads from your finger into your hand or from your toe into your foot.  MAKE SURE YOU:  Understand these instructions.  Will watch your condition.  Will get help right away if you are not doing well or get worse.   This information is not intended to replace advice given to you by your health care provider. Make sure you discuss any questions you have with your health care provider.   Document Released: 12/27/2004 Document Revised: 03/16/2013 Document Reviewed: 12/11/2012 Elsevier Interactive Patient Education [employer]

## 2016-04-01 NOTE — ED Provider Notes (Signed)
CSN: [id number]     Arrival date & time 04/01/16  0122 History   First MD Initiated Contact with Patient 04/01/16 [phone]     Chief Complaint  Patient presents with  . Toe Injury     (Consider location/radiation/quality/duration/timing/severity/associated sxs/prior Treatment) HPI   Pt with right great toe injury that occurred yesterday at 2 pm while he was setting a large 4x6" wood post. He kicked the large beam multiple times while wearing tennis shoes, and later felt pain.  When he removed his shoe he noticed blood on his sock, then later removed his sock and saw his right great toenail was loose and bleeding.  His mother was coming into the ER, so he decided to come with her and check in for evaluation.  He complains of pain over his right great toe, rated 8/10, with throbbing quality, worse with ambulation, and has no other injuries, did not take any medications prior to arrival.  Pt states he has hx of poorly controlled IDDM with admitted medical noncompliance and he has history of associated severe peripheral neuropathy and numbness.  He is a pt established with the [place], has pmhx of broken toes on the right, injury with foreign body and infection on left.  He denies any other sx including no CP, SOB, fever, chills, sweats, N, V, abdominal pain.  Other past medical hx includes CVA, blockage of coronary artery of heart with medical management, PTSD and hypertension.     Past Medical History  Diagnosis Date  . CVA (cerebral infarction)   . Diabetes mellitus without complication (HCC)   . Blockage of coronary artery of heart (HCC)   . PTSD (post-traumatic stress disorder)   . Hypertension    Past Surgical History  Procedure Laterality Date  . Shoulder surgery     History reviewed. No pertinent family history. Social History  Substance Use Topics  . Smoking status: Never Smoker   . Smokeless tobacco: None  . Alcohol Use: Yes    Review of Systems  All other systems reviewed and are  negative.     Allergies  Percocet  Home Medications   Prior to Admission medications   Medication Sig Start Date End Date Taking? Authorizing Provider  Oxycodone HCl 10 MG TABS Take 1 tablet (10 mg total) by mouth every 6 (six) hours as needed (for moderate to severe pain). 04/26/15   Darien Ramus, MD   BP 169/107 mmHg  Pulse 80  Temp(Src) 98.2 F (36.8 C) (Oral)  Resp 16  SpO2 98% Physical Exam  Constitutional: He is oriented to person, place, and time. He appears well-developed and well-nourished. No distress.  Well appearing male, NAD  HENT:  Head: Normocephalic and atraumatic.  Right Ear: External ear normal.  Left Ear: External ear normal.  Nose: Nose normal.  Mouth/Throat: Oropharynx is clear and moist. No oropharyngeal exudate.  Eyes: Conjunctivae and EOM are normal. Pupils are equal, round, and reactive to light. Right eye exhibits no discharge. Left eye exhibits no discharge. No scleral icterus.  Neck: Normal range of motion. Neck supple. No JVD present. No tracheal deviation present.  Cardiovascular: Normal rate, regular rhythm, normal heart sounds and intact distal pulses.  Exam reveals no gallop and no friction rub.   No murmur heard. Pulmonary/Chest: Effort normal and breath sounds normal. No stridor. No respiratory distress. He exhibits no tenderness.  Abdominal: Soft. Bowel sounds are normal. He exhibits no distension. There is no tenderness.  Musculoskeletal: Normal range of motion. He  exhibits no edema.       Right foot: There is tenderness and bony tenderness. There is normal range of motion, no swelling, normal capillary refill, no deformity and no laceration.       Feet:  Right foot great toe nail avulsion, no other visible injury to foot, no abrasion, laceration, deformity, edema, erythema Bilateral foot decreased sensation to light touch (chronic) No pallor   Lymphadenopathy:    He has no cervical adenopathy.  Neurological: He is alert and  oriented to person, place, and time. He exhibits normal muscle tone. Coordination normal.  Skin: Skin is warm and dry. No abrasion, no bruising, no ecchymosis, no laceration and no rash noted. He is not diaphoretic. No cyanosis or erythema. No pallor. Nails show no clubbing.  Psychiatric: He has a normal mood and affect. His behavior is normal. Judgment and thought content normal.  Nursing note and vitals reviewed.   ED Course  Procedures (including critical care time) Labs Review Labs Reviewed - No data to display  Imaging Review Dg Foot Complete Right  04/01/2016  CLINICAL DATA:  Trauma with pain in the second through fourth rays after kicking a pole. EXAM: RIGHT FOOT COMPLETE - 3+ VIEW COMPARISON:  None. FINDINGS: Only seen on the oblique radiograph is a fracture through the medial aspect of the fourth distal phalanx base. Margins appear corticated. No subluxation. There is a 6 mm long needle-like foreign body in the plantar lateral midfoot. There is also a 4 mm foreign body along the lateral and plantar little digit. IMPRESSION: 1. Nondisplaced fourth distal phalanx fracture, favored chronic but please correlate for focal tenderness. 2. Two foreign bodies along the lateral foot as described. Electronically Signed   By: Marnee SpringJonathon  Watts M.D.   On: 04/01/2016 03:06   I have personally reviewed and evaluated these images and lab results as part of my medical decision-making.   EKG Interpretation None      MDM   55 y/o male with toe injury from kicking large wooden post, causing right great toe pain and partial toenail avulsion.  X-ray were obtained which shows possible fourth distal phalanx fracture, possibly chronic, and 2 foreign bodies along the lateral foot.  Patient after x-ray attempted to place his sock back on, which caught on his loose toenail.  The primary nurse taking care of him reports that he pulled his toenail off and stuck in his pocket.   Patient had no nail bed injury,  local pain to palpation of the great toe, no other injuries concerning for infection or new possibility of foreign body. Suspect foreign bodies on x-ray are old and not acute.  Patient has had multiple injuries and trauma to bilateral feet.  The avulsed toenail and foot was soaked in normal saline and, nonadherent dressing was applied.  He was encouraged to soak and change dressing frequently and follow up with PCP as needed.  Pain was treated in the ER.  Pt was placed in post-op shoe, was discharged in good condition, VSS, noted pain improved, and otherwise asymptomatic. Filed Vitals:   04/01/16 0133 04/01/16 0619  BP: 169/107 131/77  Pulse: 80 71  Temp: 98.2 F (36.8 C) 97.8 F (36.6 C)  Resp: 16 26     Final diagnoses:  Contusion of right foot including toes, initial encounter  Nail avulsion, toe, initial encounter       Danelle BerryLeisa Enrigue Hashimi, PA-C 04/02/16 [id number]  Derwood KaplanAnkit Nanavati, MD 04/03/16 [phone]

## 2016-04-01 NOTE — Progress Notes (Signed)
Orthopedic Tech Progress Note Patient Details:  Alan PottersLeo J Bryant 07/24/1961 [id number]  Ortho Devices Type of Ortho Device: Crutches, Postop shoe/boot Ortho Device/Splint Location: rle Ortho Device/Splint Interventions: Application   Alan Bryant 04/01/2016, 7:01 AM

## 2016-04-01 NOTE — ED Notes (Addendum)
No jewelry to remove from foot.

## 2016-04-01 NOTE — ED Notes (Signed)
Patient arrives with complaint injury to toes of right foot. States he kicked a beam yesterday while attempting to align it. Beam was successfully aligned but his toes are now red, swollen, and painful.

## 2016-05-06 ENCOUNTER — Emergency Department (HOSPITAL_COMMUNITY): Payer: Non-veteran care

## 2016-05-06 ENCOUNTER — Emergency Department (HOSPITAL_COMMUNITY)
Admission: EM | Admit: 2016-05-06 | Discharge: 2016-05-06 | Disposition: A | Discharge status: Home / Self Care | Payer: Non-veteran care | Attending: Emergency Medicine | Admitting: Emergency Medicine

## 2016-05-06 ENCOUNTER — Encounter (HOSPITAL_COMMUNITY): Payer: Self-pay | Admitting: *Deleted

## 2016-05-06 DIAGNOSIS — Z79899 Other long term (current) drug therapy: Secondary | ICD-10-CM | POA: Insufficient documentation

## 2016-05-06 DIAGNOSIS — I10 Essential (primary) hypertension: Secondary | ICD-10-CM | POA: Diagnosis not present

## 2016-05-06 DIAGNOSIS — Z8659 Personal history of other mental and behavioral disorders: Secondary | ICD-10-CM | POA: Diagnosis not present

## 2016-05-06 DIAGNOSIS — G629 Polyneuropathy, unspecified: Secondary | ICD-10-CM | POA: Insufficient documentation

## 2016-05-06 DIAGNOSIS — E119 Type 2 diabetes mellitus without complications: Secondary | ICD-10-CM | POA: Insufficient documentation

## 2016-05-06 DIAGNOSIS — L03115 Cellulitis of right lower limb: Secondary | ICD-10-CM | POA: Insufficient documentation

## 2016-05-06 DIAGNOSIS — M79671 Pain in right foot: Secondary | ICD-10-CM | POA: Diagnosis present

## 2016-05-06 DIAGNOSIS — Z8673 Personal history of transient ischemic attack (TIA), and cerebral infarction without residual deficits: Secondary | ICD-10-CM | POA: Diagnosis not present

## 2016-05-06 DIAGNOSIS — L03119 Cellulitis of unspecified part of limb: Secondary | ICD-10-CM

## 2016-05-06 DIAGNOSIS — M10071 Idiopathic gout, right ankle and foot: Secondary | ICD-10-CM | POA: Insufficient documentation

## 2016-05-06 DIAGNOSIS — Z794 Long term (current) use of insulin: Secondary | ICD-10-CM | POA: Insufficient documentation

## 2016-05-06 DIAGNOSIS — M109 Gout, unspecified: Secondary | ICD-10-CM

## 2016-05-06 HISTORY — DX: Polyneuropathy, unspecified: G62.9

## 2016-05-06 LAB — I-STAT CHEM 8, ED
BUN: 21 mg/dL — ABNORMAL HIGH (ref 6–20)
CHLORIDE: 99 mmol/L — AB (ref 101–111)
Calcium, Ion: 1.19 mmol/L (ref 1.12–1.23)
Creatinine, Ser: 1.4 mg/dL — ABNORMAL HIGH (ref 0.61–1.24)
Glucose, Bld: 328 mg/dL — ABNORMAL HIGH (ref 65–99)
HEMATOCRIT: 44 % (ref 39.0–52.0)
HEMOGLOBIN: 15 g/dL (ref 13.0–17.0)
POTASSIUM: 4.2 mmol/L (ref 3.5–5.1)
SODIUM: 139 mmol/L (ref 135–145)
TCO2: 27 mmol/L (ref 0–100)

## 2016-05-06 MED ORDER — CEPHALEXIN 500 MG PO CAPS: 500.0000 mg | ORAL_CAPSULE | Freq: Four times a day (QID) | ORAL | Status: DC

## 2016-05-06 MED ORDER — COLCHICINE 0.6 MG PO TABS: 0.6000 mg | ORAL_TABLET | Freq: Every day | ORAL | Status: DC

## 2016-05-06 MED ORDER — HYDROCODONE-ACETAMINOPHEN 5-325 MG PO TABS: 1.0000 | ORAL_TABLET | Freq: Four times a day (QID) | ORAL | Status: DC | PRN

## 2016-05-06 MED ORDER — COLCHICINE 0.6 MG PO TABS
1.2000 mg | ORAL_TABLET | Freq: Once | ORAL | Status: AC
  Administered 2016-05-06: 1.2 mg via ORAL
  Filled 2016-05-06: qty 2

## 2016-05-06 MED ORDER — HYDROCODONE-ACETAMINOPHEN 5-325 MG PO TABS
2.0000 | ORAL_TABLET | Freq: Once | ORAL | Status: AC
  Administered 2016-05-06: 2 via ORAL
  Filled 2016-05-06: qty 2

## 2016-05-06 NOTE — ED Provider Notes (Signed)
CSN: [id number]     Arrival date & time 05/06/16  1903 History  By signing my name below, I, Phillis Haggis, attest that this documentation has been prepared under the direction and in the presence of Santiago Glad, PA-C. Electronically Signed: Phillis Haggis, ED Scribe. 05/06/2016. 9:02 PM.   Chief Complaint  Patient presents with  . Foot Pain    Right foot pain/redness   The history is provided by the patient. No language interpreter was used.  HPI Comments: Alan Bryant is a 55 y.o. Male with a hx of CVA, DM, HTN, gout, and neuropathy who presents to the Emergency Department complaining of gradually worsening, constant, throbbing right foot pain onset a few days ago. Pt reports that he noticed swelling and redness that began earlier today. He reports hx of broken toes on the right foot one month ago. He noticed bruising to the foot that has since resolved. He was seen by his PCP and prescribed Colchicine to relief in the past when he had Gout. Pt is non-compliant with his insulin regiment. He states that his CBG levels typically run in the 300-400s. He denies fever, nausea, vomiting, numbness, or tingling.   Past Medical History  Diagnosis Date  . CVA (cerebral infarction)   . Diabetes mellitus without complication (HCC)   . Blockage of coronary artery of heart (HCC)   . PTSD (post-traumatic stress disorder)   . Hypertension   . Neuropathy [facility])    Past Surgical History  Procedure Laterality Date  . Shoulder surgery     No family history on file. Social History  Substance Use Topics  . Smoking status: Never Smoker   . Smokeless tobacco: None  . Alcohol Use: Yes    Review of Systems A complete 10 system review of systems was obtained and all systems are negative except as noted in the HPI and PMH.   Allergies  Percocet  Home Medications   Prior to Admission medications   Medication Sig Start Date End Date Taking? Authorizing Provider  atorvastatin (LIPITOR) 80 MG tablet  Take 80 mg by mouth daily at 6 PM.   Yes Historical Provider, MD  gabapentin (NEURONTIN) 300 MG capsule Take 300 mg by mouth 3 (three) times daily.   Yes Historical Provider, MD  insulin aspart (NOVOLOG) 100 UNIT/ML injection Inject 10 Units into the skin 2 (two) times daily.   Yes Historical Provider, MD  insulin glargine (LANTUS) 100 UNIT/ML injection Inject 25 Units into the skin at bedtime.   Yes Historical Provider, MD  lisinopril (PRINIVIL,ZESTRIL) 40 MG tablet Take 40 mg by mouth 2 (two) times daily.   Yes Historical Provider, MD  metoprolol tartrate (LOPRESSOR) 25 MG tablet Take 25 mg by mouth daily.   Yes Historical Provider, MD  nitroGLYCERIN (NITRODUR - DOSED IN MG/24 HR) 0.2 mg/hr patch Place 0.2 mg onto the skin daily.   Yes Historical Provider, MD  zolpidem (AMBIEN) 10 MG tablet Take 10 mg by mouth at bedtime as needed for sleep.   Yes Historical Provider, MD   BP 172/101 mmHg  Pulse 87  Temp(Src) 97.9 F (36.6 C) (Oral)  Resp 14  SpO2 97% Physical Exam  Constitutional: He is oriented to person, place, and time. He appears well-developed and well-nourished.  HENT:  Head: Normocephalic and atraumatic.  Eyes: EOM are normal. Pupils are equal, round, and reactive to light.  Neck: Normal range of motion. Neck supple.  Cardiovascular: Normal rate, regular rhythm and normal heart sounds.  Exam  reveals no gallop and no friction rub.   No murmur heard. Pulmonary/Chest: Effort normal and breath sounds normal. He has no wheezes.  Abdominal: Soft. There is no tenderness.  Musculoskeletal: Normal range of motion.  ROM of the right great toe with pain; 2+ DP pulse to the right foot; distal sensation intact; erythema, edema, and warmth of the MTP of right great toe; erythema extending to the dorsal aspect of the right foot  Neurological: He is alert and oriented to person, place, and time.  Skin: Skin is warm and dry.  Psychiatric: He has a normal mood and affect. His behavior is normal.   Nursing note and vitals reviewed.   ED Course  Procedures (including critical care time) DIAGNOSTIC STUDIES: Oxygen Saturation is 97% on RA, normal by my interpretation.    COORDINATION OF CARE: 9:01 PM-Discussed treatment plan which includes x-ray with pt at bedside and pt agreed to plan.    Labs Review Labs Reviewed  I-STAT CHEM 8, ED    Imaging Review Dg Foot Complete Right  05/06/2016  CLINICAL DATA:  55 year old male with right foot pain EXAM: RIGHT FOOT COMPLETE - 3+ VIEW COMPARISON:  Radiograph dated 04/01/2016 FINDINGS: There is is a 4 mm linear density along the lateral aspect of the proximal phalanx of the fifth digit likely representing a foreign object and corresponding to the density seen on the prior study. A bony fracture fragment is less likely. No donor site or obvious fracture identified. A 5 mm linear density in the superficial soft tissues of the plantar aspect of foot is again noted laterally similar to prior study. There is no dislocation or subluxation. No soft tissue gas identified. IMPRESSION: No definite acute fracture or dislocation. Small foreign object in the soft tissues of the foot as seen on the prior study. Electronically Signed   By: Elgie CollardArash  Radparvar M.D.   On: 05/06/2016 21:41   I have personally reviewed and evaluated these images and lab results as part of my medical decision-making.   EKG Interpretation None      MDM   Final diagnoses:  None  Patient presents today with erythema, swelling, and pain of the right foot in the area of the MTP.  Appearance most consistent with Gout.  Patient treated with Colchicine.  Not given Indomethacin due to elevated Creatine.  Patient also has an elevated blood sugar and is non compliant with DM.  Therefore, do not feel that Prednisone is a good option.  Erythema extends to the dorsal aspect of the foot.  Will therefore, prescribe an antibiotic to also cover for Cellulitis.  Labs today showing elevated blood  sugar.  However, no anion gap.  Do not feel that he is in DKA.  Patient stable for discharge.  Patient explained the importance of taking his Insulin.  Return precautions given.    I personally performed the services described in this documentation, which was scribed in my presence. The recorded information has been reviewed and is accurate.    Santiago GladHeather Aaralynn Shepheard, PA-C 05/07/16 2035  Rolland PorterMark James, MD 05/15/16 [phone]

## 2016-05-06 NOTE — ED Notes (Signed)
States has not taken insulin in 3-4 days. States CBG's stay around 400.

## 2016-05-06 NOTE — ED Notes (Signed)
Move pt per Norva KarvonenH Laisure, PA.

## 2016-05-06 NOTE — ED Notes (Signed)
Aware will not be able to drive home d/t Hydrocodone.

## 2016-05-06 NOTE — ED Notes (Signed)
Advised pt spouse called and requesting for him to call her.

## 2016-05-06 NOTE — Discharge Instructions (Signed)

## 2016-05-06 NOTE — ED Notes (Addendum)
States drove self to ED but is able to get a ride home by spouse. Talking on phone w/spouse.

## 2016-05-06 NOTE — ED Notes (Signed)
H Laisure, PA, in w/pt. 

## 2016-05-06 NOTE — ED Notes (Signed)
Dr Lockwood in w/pt. 

## 2016-05-06 NOTE — ED Notes (Signed)
Escorted pt to his father's room - A8 - verbalized understanding of no driving d/t Hydrocodone given.

## 2016-05-06 NOTE — ED Notes (Signed)
Pharmacy to page Med Rec Tech.

## 2016-05-06 NOTE — ED Notes (Signed)
States was seen approx 1 month ago d/t broke toes on right foot d/t kicking post. States appears to have mostly healed - now has redness and swelling to anterior foot which is painful.

## 2016-05-07 LAB — CBG MONITORING, ED: GLUCOSE-CAPILLARY: 359 mg/dL — AB (ref 65–99)

## 2016-05-18 ENCOUNTER — Emergency Department (HOSPITAL_COMMUNITY)
Admission: EM | Admit: 2016-05-18 | Discharge: 2016-05-18 | Disposition: A | Discharge status: Home / Self Care | Payer: Non-veteran care | Attending: Emergency Medicine | Admitting: Emergency Medicine

## 2016-05-18 ENCOUNTER — Encounter (HOSPITAL_COMMUNITY): Payer: Self-pay | Admitting: *Deleted

## 2016-05-18 DIAGNOSIS — Z794 Long term (current) use of insulin: Secondary | ICD-10-CM | POA: Insufficient documentation

## 2016-05-18 DIAGNOSIS — I10 Essential (primary) hypertension: Secondary | ICD-10-CM | POA: Diagnosis not present

## 2016-05-18 DIAGNOSIS — M109 Gout, unspecified: Secondary | ICD-10-CM | POA: Diagnosis not present

## 2016-05-18 DIAGNOSIS — Z7982 Long term (current) use of aspirin: Secondary | ICD-10-CM | POA: Diagnosis not present

## 2016-05-18 DIAGNOSIS — F4310 Post-traumatic stress disorder, unspecified: Secondary | ICD-10-CM | POA: Insufficient documentation

## 2016-05-18 DIAGNOSIS — Z79899 Other long term (current) drug therapy: Secondary | ICD-10-CM | POA: Insufficient documentation

## 2016-05-18 DIAGNOSIS — M79671 Pain in right foot: Secondary | ICD-10-CM | POA: Diagnosis present

## 2016-05-18 DIAGNOSIS — IMO0001 Reserved for inherently not codable concepts without codable children: Secondary | ICD-10-CM

## 2016-05-18 DIAGNOSIS — E1140 Type 2 diabetes mellitus with diabetic neuropathy, unspecified: Secondary | ICD-10-CM | POA: Diagnosis not present

## 2016-05-18 DIAGNOSIS — Z8673 Personal history of transient ischemic attack (TIA), and cerebral infarction without residual deficits: Secondary | ICD-10-CM | POA: Diagnosis not present

## 2016-05-18 DIAGNOSIS — R030 Elevated blood-pressure reading, without diagnosis of hypertension: Secondary | ICD-10-CM

## 2016-05-18 MED ORDER — HYDROCODONE-ACETAMINOPHEN 5-325 MG PO TABS: 1.0000 | ORAL_TABLET | Freq: Four times a day (QID) | ORAL | Status: DC | PRN

## 2016-05-18 MED ORDER — COLCHICINE 0.6 MG PO TABS: 0.6000 mg | ORAL_TABLET | Freq: Every day | ORAL | Status: DC

## 2016-05-18 NOTE — ED Provider Notes (Signed)
CSN: [id number]     Arrival date & time 05/18/16  1806 History  By signing my name below, I, Alan Bryant, attest that this documentation has been prepared under the direction and in the presence of non-physician practitioner, Alan SauerJaime Ward, PA-C. Electronically Signed: Linna Darnerussell Bryant, Scribe. 05/18/2016. 7:57 PM.     Chief Complaint  Patient presents with  . Foot Pain    The history is provided by the patient. No language interpreter was used.    HPI Comments: Alan Bryant is a 55 y.o. male with PMHx of gout, DM, and neuropathy who presents to the Emergency Department complaining of sudden onset, constant, severe, right big toe pain which he states is due to gout beginning around 3 hours ago. Pt was seen here on 05/06/16 for the same symptoms and was prescribed Colchicine with total relief of his pain. Pt had 1 leftover Colchicine pill from his visit on 05/06/16 and took it today PTA; he states that he also has been continuing to take his ABX prescribed at last visit. He notes that his blood sugar has been controlled lately. He reports that he has a follow-up appointment with his PCP scheduled for 05/29/16. He denies fever, right ankle pain, numbness, neuro deficits, or any other associated symptoms.  Past Medical History  Diagnosis Date  . CVA (cerebral infarction)   . Diabetes mellitus without complication (HCC)   . Blockage of coronary artery of heart (HCC)   . PTSD (post-traumatic stress disorder)   . Hypertension   . Neuropathy [facility](HCC)    Past Surgical History  Procedure Laterality Date  . Shoulder surgery     History reviewed. No pertinent family history. Social History  Substance Use Topics  . Smoking status: Never Smoker   . Smokeless tobacco: None  . Alcohol Use: Yes    Review of Systems  Musculoskeletal: Positive for arthralgias (right big toe).       Negative for right ankle pain.  Neurological: Negative for numbness.       Negative for sensation loss.   Allergies   Percocet  Home Medications   Prior to Admission medications   Medication Sig Start Date End Date Taking? Authorizing Provider  Aspirin-Salicylamide-Caffeine (BC HEADACHE) 325-95-16 MG TABS Take 1 packet by mouth daily as needed (for headache).    Historical Provider, MD  atorvastatin (LIPITOR) 80 MG tablet Take 80 mg by mouth daily at 6 PM.    Historical Provider, MD  cephALEXin (KEFLEX) 500 MG capsule Take 1 capsule (500 mg total) by mouth 4 (four) times daily. 05/06/16   Alan Laisure, PA-C  colchicine 0.6 MG tablet Take 1 tablet (0.6 mg total) by mouth daily. 05/18/16   Alan PicketJaime Pilcher Ward, PA-C  gabapentin (NEURONTIN) 300 MG capsule Take 1,200 mg by mouth 2 (two) times daily.     Historical Provider, MD  haloperidol (HALDOL) 1 MG tablet Take 1 mg by mouth at bedtime.    Historical Provider, MD  HYDROcodone-acetaminophen (NORCO/VICODIN) 5-325 MG tablet Take 1 tablet by mouth every 6 (six) hours as needed for severe pain. 05/18/16   Alan PicketJaime Pilcher Ward, PA-C  hydrOXYzine (VISTARIL) 25 MG capsule Take 25 mg by mouth 2 (two) times daily as needed for anxiety.    Historical Provider, MD  insulin aspart (NOVOLOG) 100 UNIT/ML injection Inject 10-40 Units into the skin 2 (two) times daily. 40 units in the morning and 10 units at bedtime    Historical Provider, MD  insulin glargine (LANTUS) 100 UNIT/ML injection Inject  25 Units into the skin at bedtime.    Historical Provider, MD  lisinopril (PRINIVIL,ZESTRIL) 40 MG tablet Take 40 mg by mouth 2 (two) times daily.    Historical Provider, MD  metoprolol tartrate (LOPRESSOR) 25 MG tablet Take 25 mg by mouth daily.    Historical Provider, MD  nitroGLYCERIN (NITRODUR - DOSED IN MG/24 HR) 0.2 mg/hr patch Place 0.2 mg onto the skin daily.    Historical Provider, MD  zolpidem (AMBIEN) 10 MG tablet Take 10 mg by mouth at bedtime as needed for sleep.    Historical Provider, MD   BP 188/121 mmHg  Pulse 85  Temp(Src) 98.2 F (36.8 C) (Oral)  Resp 18  Wt  109.317 kg  SpO2 98% Physical Exam  Constitutional: He is oriented to person, place, and time. He appears well-developed and well-nourished. No distress.  HENT:  Head: Normocephalic and atraumatic.  Neck: Neck supple. No tracheal deviation present.  Cardiovascular: Normal rate, regular rhythm and normal heart sounds.   No murmur heard. Pulmonary/Chest: Effort normal and breath sounds normal. No respiratory distress.  Musculoskeletal:  TTP of MTP joint on right big toe. No swelling or warmth to the touch. Mild erythema.   Neurological: He is alert and oriented to person, place, and time.  Skin: Skin is warm and dry.  Psychiatric: He has a normal mood and affect. His behavior is normal.  Nursing note and vitals reviewed.  ED Course  Procedures (including critical care time)  DIAGNOSTIC STUDIES: Oxygen Saturation is 99% on RA, normal by my interpretation.    COORDINATION OF CARE: 7:57 PM Discussed treatment plan with pt at bedside and pt agreed to plan.  Labs Review Labs Reviewed - No data to display  Imaging Review No results found. I have personally reviewed and evaluated these images and lab results as part of my medical decision-making.   EKG Interpretation None      MDM   Final diagnoses:  Elevated blood pressure  Gout of right foot, unspecified cause, unspecified chronicity   Alan Bryant presents to ED for right big toe pain that began today c/w typically gout flares. Patient was seen on 6/04 for same and states total resolution of that episode after a few days. On exam, there is TTP but no warmth or swelling and very minimal erythema. Does not appear cellulitic. Patient's BP was elevated in ED today - patient states he did not take his BP meds today. Encouraged him to take BP meds every single day as directed. He has a follow up appointment scheduled with his PCP at the [place] on 6/27 - encouraged to keep scheduled appointment. Colchine and short course pain meds given.  Return precautions given and all questions answered.   I personally performed the services described in this documentation, which was scribed in my presence. The recorded information has been reviewed and is accurate.  [facility] Monroe Ward, PA-C 05/18/16 2303  Linwood Dibbles, MD 05/19/16 [phone]

## 2016-05-18 NOTE — Discharge Instructions (Signed)
Keep your scheduled appointment with your primary care provider.  YOUR BLOOD PRESSURE WAS ELEVATED TODAY. IT IS VERY IMPORTANT TO TAKE YOUR BLOOD PRESSURE MEDICATION EVERY DAY.  Return to ER for worsening redness, fevers, new or worsening symptoms, any additional concerns.

## 2016-05-18 NOTE — ED Notes (Signed)
Pt reports return of goutpain to right big toe today, is out of his meds.

## 2016-07-17 ENCOUNTER — Encounter: Payer: Self-pay | Admitting: Emergency Medicine

## 2016-07-17 ENCOUNTER — Emergency Department
Admission: EM | Admit: 2016-07-17 | Discharge: 2016-07-17 | Disposition: A | Discharge status: Other Acute Inpatient Hospital | Payer: Non-veteran care | Attending: Emergency Medicine | Admitting: Emergency Medicine

## 2016-07-17 ENCOUNTER — Inpatient Hospital Stay (HOSPITAL_COMMUNITY): Payer: Non-veteran care

## 2016-07-17 ENCOUNTER — Encounter (HOSPITAL_COMMUNITY): Payer: Self-pay | Admitting: Neurology

## 2016-07-17 ENCOUNTER — Emergency Department: Payer: Self-pay

## 2016-07-17 ENCOUNTER — Inpatient Hospital Stay (HOSPITAL_COMMUNITY)
Admission: EM | Admit: 2016-07-17 | Discharge: 2016-07-18 | DRG: 065 | Disposition: A | Discharge status: Home / Self Care | Payer: Non-veteran care | Source: Other Acute Inpatient Hospital | Attending: Neurology | Admitting: Neurology

## 2016-07-17 DIAGNOSIS — I2510 Atherosclerotic heart disease of native coronary artery without angina pectoris: Secondary | ICD-10-CM | POA: Diagnosis present

## 2016-07-17 DIAGNOSIS — I129 Hypertensive chronic kidney disease with stage 1 through stage 4 chronic kidney disease, or unspecified chronic kidney disease: Secondary | ICD-10-CM | POA: Diagnosis present

## 2016-07-17 DIAGNOSIS — Z9114 Patient's other noncompliance with medication regimen: Secondary | ICD-10-CM

## 2016-07-17 DIAGNOSIS — R471 Dysarthria and anarthria: Secondary | ICD-10-CM | POA: Diagnosis present

## 2016-07-17 DIAGNOSIS — N179 Acute kidney failure, unspecified: Secondary | ICD-10-CM | POA: Diagnosis present

## 2016-07-17 DIAGNOSIS — R270 Ataxia, unspecified: Secondary | ICD-10-CM | POA: Diagnosis present

## 2016-07-17 DIAGNOSIS — R29707 NIHSS score 7: Secondary | ICD-10-CM | POA: Diagnosis present

## 2016-07-17 DIAGNOSIS — E1122 Type 2 diabetes mellitus with diabetic chronic kidney disease: Secondary | ICD-10-CM | POA: Diagnosis present

## 2016-07-17 DIAGNOSIS — E876 Hypokalemia: Secondary | ICD-10-CM | POA: Diagnosis present

## 2016-07-17 DIAGNOSIS — R4781 Slurred speech: Secondary | ICD-10-CM | POA: Diagnosis present

## 2016-07-17 DIAGNOSIS — D631 Anemia in chronic kidney disease: Secondary | ICD-10-CM | POA: Diagnosis present

## 2016-07-17 DIAGNOSIS — E871 Hypo-osmolality and hyponatremia: Secondary | ICD-10-CM | POA: Diagnosis present

## 2016-07-17 DIAGNOSIS — I959 Hypotension, unspecified: Secondary | ICD-10-CM

## 2016-07-17 DIAGNOSIS — E669 Obesity, unspecified: Secondary | ICD-10-CM | POA: Diagnosis present

## 2016-07-17 DIAGNOSIS — E785 Hyperlipidemia, unspecified: Secondary | ICD-10-CM | POA: Diagnosis present

## 2016-07-17 DIAGNOSIS — F17210 Nicotine dependence, cigarettes, uncomplicated: Secondary | ICD-10-CM | POA: Diagnosis present

## 2016-07-17 DIAGNOSIS — E872 Acidosis: Secondary | ICD-10-CM | POA: Diagnosis present

## 2016-07-17 DIAGNOSIS — Z6834 Body mass index (BMI) 34.0-34.9, adult: Secondary | ICD-10-CM

## 2016-07-17 DIAGNOSIS — I6330 Cerebral infarction due to thrombosis of unspecified cerebral artery: Secondary | ICD-10-CM | POA: Diagnosis not present

## 2016-07-17 DIAGNOSIS — Z794 Long term (current) use of insulin: Secondary | ICD-10-CM

## 2016-07-17 DIAGNOSIS — F1410 Cocaine abuse, uncomplicated: Secondary | ICD-10-CM | POA: Diagnosis present

## 2016-07-17 DIAGNOSIS — E1165 Type 2 diabetes mellitus with hyperglycemia: Secondary | ICD-10-CM | POA: Diagnosis present

## 2016-07-17 DIAGNOSIS — R29810 Facial weakness: Secondary | ICD-10-CM | POA: Diagnosis present

## 2016-07-17 DIAGNOSIS — G8194 Hemiplegia, unspecified affecting left nondominant side: Secondary | ICD-10-CM | POA: Diagnosis present

## 2016-07-17 DIAGNOSIS — I10 Essential (primary) hypertension: Secondary | ICD-10-CM | POA: Diagnosis not present

## 2016-07-17 DIAGNOSIS — N182 Chronic kidney disease, stage 2 (mild): Secondary | ICD-10-CM | POA: Diagnosis present

## 2016-07-17 DIAGNOSIS — Z885 Allergy status to narcotic agent status: Secondary | ICD-10-CM

## 2016-07-17 DIAGNOSIS — Z8249 Family history of ischemic heart disease and other diseases of the circulatory system: Secondary | ICD-10-CM

## 2016-07-17 DIAGNOSIS — Z7901 Long term (current) use of anticoagulants: Secondary | ICD-10-CM

## 2016-07-17 DIAGNOSIS — E119 Type 2 diabetes mellitus without complications: Secondary | ICD-10-CM | POA: Insufficient documentation

## 2016-07-17 DIAGNOSIS — E1159 Type 2 diabetes mellitus with other circulatory complications: Secondary | ICD-10-CM | POA: Diagnosis not present

## 2016-07-17 DIAGNOSIS — I639 Cerebral infarction, unspecified: Secondary | ICD-10-CM | POA: Insufficient documentation

## 2016-07-17 DIAGNOSIS — Z7289 Other problems related to lifestyle: Secondary | ICD-10-CM

## 2016-07-17 DIAGNOSIS — Z79899 Other long term (current) drug therapy: Secondary | ICD-10-CM | POA: Insufficient documentation

## 2016-07-17 DIAGNOSIS — F4310 Post-traumatic stress disorder, unspecified: Secondary | ICD-10-CM | POA: Diagnosis present

## 2016-07-17 DIAGNOSIS — E1310 Other specified diabetes mellitus with ketoacidosis without coma: Secondary | ICD-10-CM | POA: Diagnosis not present

## 2016-07-17 LAB — PROCALCITONIN: PROCALCITONIN: 0.23 ng/mL

## 2016-07-17 LAB — DIFFERENTIAL
BASOS ABS: 0.2 10*3/uL — AB (ref 0–0.1)
Basophils Relative: 1 %
EOS ABS: 0.3 10*3/uL (ref 0–0.7)
Eosinophils Relative: 3 %
LYMPHS ABS: 3.2 10*3/uL (ref 1.0–3.6)
Lymphocytes Relative: 28 %
MONOS PCT: 5 %
Monocytes Absolute: 0.5 10*3/uL (ref 0.2–1.0)
Neutro Abs: 7.3 10*3/uL — ABNORMAL HIGH (ref 1.4–6.5)
Neutrophils Relative %: 63 %

## 2016-07-17 LAB — COMPREHENSIVE METABOLIC PANEL
ALT: 41 U/L (ref 17–63)
AST: 37 U/L (ref 15–41)
Albumin: 3.8 g/dL (ref 3.5–5.0)
Alkaline Phosphatase: 108 U/L (ref 38–126)
Anion gap: 16 — ABNORMAL HIGH (ref 5–15)
BILIRUBIN TOTAL: 0.7 mg/dL (ref 0.3–1.2)
BUN: 31 mg/dL — AB (ref 6–20)
CO2: 19 mmol/L — ABNORMAL LOW (ref 22–32)
CREATININE: 2.66 mg/dL — AB (ref 0.61–1.24)
Calcium: 9.3 mg/dL (ref 8.9–10.3)
Chloride: 91 mmol/L — ABNORMAL LOW (ref 101–111)
GFR calc Af Amer: 30 mL/min — ABNORMAL LOW (ref >60)
GFR, EST NON AFRICAN AMERICAN: 26 mL/min — AB (ref >60)
Glucose, Bld: 584 mg/dL (ref 65–99)
POTASSIUM: 4 mmol/L (ref 3.5–5.1)
Sodium: 126 mmol/L — ABNORMAL LOW (ref 135–145)
TOTAL PROTEIN: 6.9 g/dL (ref 6.5–8.1)

## 2016-07-17 LAB — MRSA PCR SCREENING: MRSA BY PCR: NEGATIVE

## 2016-07-17 LAB — URINALYSIS, ROUTINE W REFLEX MICROSCOPIC
Bilirubin Urine: NEGATIVE
Glucose, UA: >1000 mg/dL — AB
HGB URINE DIPSTICK: NEGATIVE
Ketones, ur: NEGATIVE mg/dL
LEUKOCYTES UA: NEGATIVE
Nitrite: NEGATIVE
Protein, ur: NEGATIVE mg/dL
SPECIFIC GRAVITY, URINE: 1.025 (ref 1.005–1.030)
pH: 5 (ref 5.0–8.0)

## 2016-07-17 LAB — GLUCOSE, CAPILLARY
GLUCOSE-CAPILLARY: 273 mg/dL — AB (ref 65–99)
GLUCOSE-CAPILLARY: 162 mg/dL — AB (ref 65–99)
GLUCOSE-CAPILLARY: 182 mg/dL — AB (ref 65–99)
Glucose-Capillary: 149 mg/dL — ABNORMAL HIGH (ref 65–99)
Glucose-Capillary: 201 mg/dL — ABNORMAL HIGH (ref 65–99)
Glucose-Capillary: 218 mg/dL — ABNORMAL HIGH (ref 65–99)
Glucose-Capillary: 224 mg/dL — ABNORMAL HIGH (ref 65–99)
Glucose-Capillary: 252 mg/dL — ABNORMAL HIGH (ref 65–99)
Glucose-Capillary: 262 mg/dL — ABNORMAL HIGH (ref 65–99)
Glucose-Capillary: 511 mg/dL (ref 65–99)
Glucose-Capillary: 599 mg/dL (ref 65–99)
Glucose-Capillary: >600 mg/dL (ref 65–99)

## 2016-07-17 LAB — CBC
HEMATOCRIT: 46.8 % (ref 40.0–52.0)
HEMOGLOBIN: 15.9 g/dL (ref 13.0–18.0)
MCH: 28.8 pg (ref 26.0–34.0)
MCHC: 34 g/dL (ref 32.0–36.0)
MCV: 84.6 fL (ref 80.0–100.0)
Platelets: 278 10*3/uL (ref 150–440)
RBC: 5.54 MIL/uL (ref 4.40–5.90)
RDW: 12.8 % (ref 11.5–14.5)
WBC: 11.5 10*3/uL — ABNORMAL HIGH (ref 3.8–10.6)

## 2016-07-17 LAB — RAPID URINE DRUG SCREEN, HOSP PERFORMED
AMPHETAMINES: NOT DETECTED
BENZODIAZEPINES: NOT DETECTED
Barbiturates: NOT DETECTED
Cocaine: POSITIVE — AB
Opiates: NOT DETECTED
TETRAHYDROCANNABINOL: NOT DETECTED

## 2016-07-17 LAB — PROTIME-INR
INR: 0.93
Prothrombin Time: 12.5 seconds (ref 11.4–15.2)

## 2016-07-17 LAB — URINE MICROSCOPIC-ADD ON: Bacteria, UA: NONE SEEN

## 2016-07-17 LAB — TROPONIN I: TROPONIN I: 0.06 ng/mL — AB (ref <0.03)

## 2016-07-17 LAB — APTT: APTT: 29 s (ref 24–36)

## 2016-07-17 LAB — LACTIC ACID, PLASMA
LACTIC ACID, VENOUS: 2 mmol/L — AB (ref 0.5–1.9)
Lactic Acid, Venous: 2.4 mmol/L (ref 0.5–1.9)

## 2016-07-17 LAB — BETA-HYDROXYBUTYRIC ACID: BETA-HYDROXYBUTYRIC ACID: 0.11 mmol/L (ref 0.05–0.27)

## 2016-07-17 MED ORDER — SODIUM CHLORIDE 0.9 % IV SOLN
50.0000 mL | Freq: Once | INTRAVENOUS | Status: AC
  Administered 2016-07-17: 50 mL via INTRAVENOUS

## 2016-07-17 MED ORDER — INSULIN ASPART 100 UNIT/ML ~~LOC~~ SOLN: 10.0000 [IU] | Freq: Two times a day (BID) | SUBCUTANEOUS | Status: DC

## 2016-07-17 MED ORDER — INSULIN REGULAR HUMAN 100 UNIT/ML IJ SOLN
INTRAMUSCULAR | Status: DC
  Administered 2016-07-17: 4.5 [IU]/h via INTRAVENOUS
  Filled 2016-07-17: qty 2.5

## 2016-07-17 MED ORDER — LOPERAMIDE HCL 2 MG PO CAPS
2.0000 mg | ORAL_CAPSULE | ORAL | Status: DC | PRN
  Administered 2016-07-17: 2 mg via ORAL
  Filled 2016-07-17 (×2): qty 1

## 2016-07-17 MED ORDER — INSULIN GLARGINE 100 UNIT/ML ~~LOC~~ SOLN
25.0000 [IU] | Freq: Every day | SUBCUTANEOUS | Status: DC
  Filled 2016-07-17 (×2): qty 0.25

## 2016-07-17 MED ORDER — SODIUM CHLORIDE 0.9 % IV BOLUS (SEPSIS)
1000.0000 mL | Freq: Once | INTRAVENOUS | Status: AC
  Administered 2016-07-17: 1000 mL via INTRAVENOUS

## 2016-07-17 MED ORDER — IOPAMIDOL (ISOVUE-370) INJECTION 76%
75.0000 mL | Freq: Once | INTRAVENOUS | Status: AC | PRN
  Administered 2016-07-17: 75 mL via INTRAVENOUS

## 2016-07-17 MED ORDER — PANTOPRAZOLE SODIUM 40 MG PO TBEC
40.0000 mg | DELAYED_RELEASE_TABLET | Freq: Every day | ORAL | Status: DC
  Administered 2016-07-17 – 2016-07-18 (×2): 40 mg via ORAL
  Filled 2016-07-17 (×2): qty 1

## 2016-07-17 MED ORDER — ALTEPLASE 100 MG IV SOLR
INTRAVENOUS | Status: AC
  Administered 2016-07-17: 90 mg via INTRAVENOUS
  Filled 2016-07-17: qty 100

## 2016-07-17 MED ORDER — PHENYLEPHRINE HCL 10 MG/ML IJ SOLN
0.0000 ug/min | INTRAVENOUS | Status: DC
  Administered 2016-07-17: 20 ug/min via INTRAVENOUS
  Administered 2016-07-18: 40 ug/min via INTRAVENOUS
  Filled 2016-07-17 (×2): qty 1

## 2016-07-17 MED ORDER — STROKE: EARLY STAGES OF RECOVERY BOOK
Freq: Once | Status: AC
  Administered 2016-07-17: 13:00:00
  Filled 2016-07-17: qty 1

## 2016-07-17 MED ORDER — GABAPENTIN 400 MG PO CAPS
1200.0000 mg | ORAL_CAPSULE | Freq: Two times a day (BID) | ORAL | Status: DC
  Administered 2016-07-17 – 2016-07-18 (×2): 1200 mg via ORAL
  Filled 2016-07-17 (×2): qty 3

## 2016-07-17 MED ORDER — ALTEPLASE (STROKE) FULL DOSE INFUSION
90.0000 mg | Freq: Once | INTRAVENOUS | Status: AC
  Administered 2016-07-17: 90 mg via INTRAVENOUS

## 2016-07-17 MED ORDER — SODIUM CHLORIDE 0.9 % IV SOLN
INTRAVENOUS | Status: DC
  Administered 2016-07-17 – 2016-07-18 (×2): via INTRAVENOUS

## 2016-07-17 MED ORDER — ATORVASTATIN CALCIUM 80 MG PO TABS
80.0000 mg | ORAL_TABLET | Freq: Every day | ORAL | Status: DC
  Administered 2016-07-17: 80 mg via ORAL
  Filled 2016-07-17: qty 1

## 2016-07-17 MED ORDER — INSULIN ASPART 100 UNIT/ML ~~LOC~~ SOLN: 10.0000 [IU] | Freq: Once | SUBCUTANEOUS | Status: DC

## 2016-07-17 MED ORDER — SENNOSIDES-DOCUSATE SODIUM 8.6-50 MG PO TABS: 1.0000 | ORAL_TABLET | Freq: Every evening | ORAL | Status: DC | PRN

## 2016-07-17 NOTE — Consult Note (Signed)
PULMONARY / CRITICAL CARE MEDICINE   Name: Alan Bryant MRN: [id number] DOB: 09/12/1961    ADMISSION DATE:  07/17/2016 CONSULTATION DATE:  8/15  REFERRING MD:  Dr. Amada Jupiter  CHIEF COMPLAINT:  CVA  HISTORY OF PRESENT ILLNESS:   55 year old male with PMH as below, which is significant for CAD, CVA, DM, and HTN. 8/15 he awoke feeling unwell. He attempted to work, but remained off balance and developed slurred speech and facial droop. He presented to the ER at [facility] with concern for CVA. CT scan without acute finding so he was administered tPA. He was transferred to [facility] for further evaluation. En route he remained hypotensive and received to total of 4 L IVF without significant improvement. He was admitted to the neuro ICU under stroke service. Laboratory evaluation notable for Na 126, CO2 19, Lactic acid 2.4, troponin 0.06, Serum creatinine 2.66, and glucose 584. He was started on insulin infusion per protocol. PCCM consulted for continued hypotension.  PAST MEDICAL HISTORY :  He  has a past medical history of Blockage of coronary artery of heart (HCC); CVA (cerebral infarction); Diabetes mellitus without complication (HCC); Hypertension; Neuropathy (HCC); and PTSD (post-traumatic stress disorder).  PAST SURGICAL HISTORY: He  has a past surgical history that includes Shoulder surgery.  Allergies  Allergen Reactions  . Percocet [Oxycodone-Acetaminophen] Hives and Itching    No current facility-administered medications on file prior to encounter.    Current Outpatient Prescriptions on File Prior to Encounter  Medication Sig  . Aspirin-Salicylamide-Caffeine (BC HEADACHE) 325-95-16 MG TABS Take 1 packet by mouth daily as needed (for headache).  Marland Kitchen atorvastatin (LIPITOR) 80 MG tablet Take 80 mg by mouth daily at 6 PM.  . gabapentin (NEURONTIN) 300 MG capsule Take 1,200 mg by mouth 2 (two) times daily.   . haloperidol (HALDOL) 1 MG tablet Take 1 mg by mouth at bedtime.  . hydrOXYzine  (VISTARIL) 25 MG capsule Take 25 mg by mouth 2 (two) times daily as needed for anxiety.  . insulin aspart (NOVOLOG) 100 UNIT/ML injection Inject 10-40 Units into the skin 2 (two) times daily. 40 units in the morning and 10 units at bedtime  . insulin glargine (LANTUS) 100 UNIT/ML injection Inject 25 Units into the skin at bedtime.  Marland Kitchen lisinopril (PRINIVIL,ZESTRIL) 40 MG tablet Take 40 mg by mouth 2 (two) times daily.  . metoprolol tartrate (LOPRESSOR) 25 MG tablet Take 25 mg by mouth daily.  . nitroGLYCERIN (NITRODUR - DOSED IN MG/24 HR) 0.2 mg/hr patch Place 0.2 mg onto the skin daily.  Marland Kitchen zolpidem (AMBIEN) 10 MG tablet Take 10 mg by mouth at bedtime as needed for sleep.    FAMILY HISTORY:  His indicated that the status of his mother is unknown. He indicated that the status of his father is unknown.    SOCIAL HISTORY: He  reports that he has never smoked. He does not have any smokeless tobacco history on file. He reports that he drinks alcohol. He reports that he uses drugs, including Marijuana.  REVIEW OF SYSTEMS:   Bolds are positive  Constitutional: weight loss, gain, night sweats, Fevers, chills, fatigue .  HEENT: headaches, Sore throat, sneezing, nasal congestion, post nasal drip, Difficulty swallowing, Tooth/dental problems, visual complaints visual changes, ear ache CV:  chest pain, radiates: ,Orthopnea, PND, swelling in lower extremities, dizziness, palpitations, syncope.  GI  heartburn, indigestion, abdominal pain, nausea, vomiting, diarrhea, change in bowel habits, loss of appetite, bloody stools.  Resp: cough, productive: , hemoptysis, dyspnea,  chest pain, pleuritic.  Skin: rash or itching or icterus GU: dysuria, change in color of urine, urgency or frequency. flank pain, hematuria  MS: joint pain or swelling. decreased range of motion  Psych: change in mood or affect. depression or anxiety.  Neuro: difficulty with speech, weakness, numbness, ataxia    SUBJECTIVE:    VITAL  SIGNS: BP (!) 84/65   Pulse 77   Temp 97.9 F (36.6 C) (Oral)   Resp 20   SpO2 99%   HEMODYNAMICS:    VENTILATOR SETTINGS:    INTAKE / OUTPUT: No intake/output data recorded.  PHYSICAL EXAMINATION: General:  Overweight male in NAD Neuro:  Alert, oriented, slurred speech, minimal L facial droop HEENT:  [place]/AT, PERRL Cardiovascular:  RRR, no MRG Lungs:  Clear bilateral Abdomen:  Soft, non-tender, non-distended Musculoskeletal:  No acute deformity, L hemiparesis Skin:  Grossly intact  LABS:  BMET  Recent Labs Lab 07/17/16 1032  NA 126*  K 4.0  CL 91*  CO2 19*  BUN 31*  CREATININE 2.66*  GLUCOSE 584*    Electrolytes  Recent Labs Lab 07/17/16 1032  CALCIUM 9.3    CBC  Recent Labs Lab 07/17/16 1032  WBC 11.5*  HGB 15.9  HCT 46.8  PLT 278    Coag's  Recent Labs Lab 07/17/16 1032  APTT 29  INR 0.93    Sepsis Markers  Recent Labs Lab 07/17/16 1505 07/17/16 1506  LATICACIDVEN 2.4*  --   PROCALCITON  --  0.23    ABG No results for input(s): PHART, PCO2ART, PO2ART in the last 168 hours.  Liver Enzymes  Recent Labs Lab 07/17/16 1032  AST 37  ALT 41  ALKPHOS 108  BILITOT 0.7  ALBUMIN 3.8    Cardiac Enzymes  Recent Labs Lab 07/17/16 1032  TROPONINI 0.06*    Glucose  Recent Labs Lab 07/17/16 1042 07/17/16 1044 07/17/16 1246 07/17/16 1455 07/17/16 1547  GLUCAP 599* >600* 511* 273* 224*    Imaging Ct Angio Head W Or Wo Contrast  Result Date: 07/17/2016 CLINICAL DATA:  Emergency presentation with slurred speech and left facial droop. EXAM: CT ANGIOGRAPHY HEAD AND NECK TECHNIQUE: Multidetector CT imaging of the head and neck was performed using the standard protocol during bolus administration of intravenous contrast. Multiplanar CT image reconstructions and MIPs were obtained to evaluate the vascular anatomy. Carotid stenosis measurements (when applicable) are obtained utilizing NASCET criteria, using the distal  internal carotid diameter as the denominator. CONTRAST:  75 cc Isovue 370 COMPARISON:  Head CT earlier today. FINDINGS: CT HEAD The brain does not show advanced generalized atrophy. There are old small vessel cerebellar infarctions on the right. Cerebral hemispheres show chronic small-vessel ischemic changes of the white matter. No identifiable acute infarction. No hemorrhage, mass effect, hydrocephalus or extra-axial collection. There is atherosclerotic calcification of the major vessels at the base of the brain. CTA NECK Aortic arch: Aortic atherosclerosis. No brachiocephalic vessel origin stenosis. Common origin of the innominate artery and left common carotid artery. Right carotid system: Diffuse atherosclerotic disease of the common carotid artery with narrowing throughout the course of 30-40%. Atherosclerotic disease at the carotid bifurcation and ICA bulb. Minimal diameter in the ICA bulb measures 1.3 mm. Compared to a more distal cervical ICA diameter of 4 mm, this indicates a 70% stenosis. Left carotid system: Common carotid artery shows diffuse atherosclerotic disease with narrowing throughout its course estimated at 30%. Atherosclerotic disease at the carotid bifurcation and ICA bulb. Minimal ICA bulb diameter is 2  mm. Compared to a more distal cervical ICA diameter of 5 mm, this indicates a 60% stenosis. Vertebral arteries:Vertebral artery origins cannot be evaluated because of shoulder density and venous reflux. In the midcervical region, there is flow in both vertebral arteries, though the vessels show diffuse atherosclerotic narrowing and irregularity. Skeleton: Negative Other neck: No mass or lymphadenopathy. Upper chest: Lung apices are clear. CTA HEAD Anterior circulation: Severe atherosclerotic disease affecting the internal carotid arteries as they pass through the skullbase, this is particularly pronounced on the left with narrowing and irregularity in the carotid canal. Both carotid siphons  show advanced atherosclerotic disease with calcification and narrowing. Stenoses in these regions are probably 70% or greater. The anterior and middle cerebral vessels are patent but show narrowing and irregularity bilaterally. A1 segment on the right is quite diminutive. No evidence of medium or large vessel intracranial occlusion. Distal vessel irregularity is present. Posterior circulation: Both vertebral arteries are patent through the foramen magnum. There is stenosis and irregularity of the distal vertebral arteries bilaterally. The proximal basilar artery is markedly stenotic. Vessel limb an in that region is 1 mm or less. Basilar artery shows diffuse atherosclerotic narrowing and irregularity. Flow is present in the superior cerebellar and posterior cerebral arteries bilaterally. PCAs receive most of there supply from the anterior circulation. Distal vessel irregularity is present. Venous sinuses: Patent and normal Anatomic variants: None significant Delayed phase: No abnormal enhancement IMPRESSION: Severe diffuse atherosclerotic disease. Vessels in the neck show circumferential intimal thickening. Atherosclerotic disease affecting the internal carotid arteries bilaterally. Stenosis on the right estimated at 70%. Stenosis on the left estimated at 60%. Severe atherosclerotic narrowing of the internal carotid arteries as they pass through the skullbase in the siphon regions. There are probable serial 70% stenoses on both sides. Patient is certainly be at risk of internal carotid artery occlusion on either or both sides. Severe stenosis affecting the distal vertebral arteries and the proximal basilar artery. The patient would be at risk of posterior circulation occlusion. Distal vessel atherosclerotic irregularity diffusely consistent with intracranial medium to small-vessel atherosclerotic disease. Electronically Signed   By: Paulina Fusi M.D.   On: 07/17/2016 11:59   Ct Angio Neck W Or Wo  Contrast  Result Date: 07/17/2016 CLINICAL DATA:  Emergency presentation with slurred speech and left facial droop. EXAM: CT ANGIOGRAPHY HEAD AND NECK TECHNIQUE: Multidetector CT imaging of the head and neck was performed using the standard protocol during bolus administration of intravenous contrast. Multiplanar CT image reconstructions and MIPs were obtained to evaluate the vascular anatomy. Carotid stenosis measurements (when applicable) are obtained utilizing NASCET criteria, using the distal internal carotid diameter as the denominator. CONTRAST:  75 cc Isovue 370 COMPARISON:  Head CT earlier today. FINDINGS: CT HEAD The brain does not show advanced generalized atrophy. There are old small vessel cerebellar infarctions on the right. Cerebral hemispheres show chronic small-vessel ischemic changes of the white matter. No identifiable acute infarction. No hemorrhage, mass effect, hydrocephalus or extra-axial collection. There is atherosclerotic calcification of the major vessels at the base of the brain. CTA NECK Aortic arch: Aortic atherosclerosis. No brachiocephalic vessel origin stenosis. Common origin of the innominate artery and left common carotid artery. Right carotid system: Diffuse atherosclerotic disease of the common carotid artery with narrowing throughout the course of 30-40%. Atherosclerotic disease at the carotid bifurcation and ICA bulb. Minimal diameter in the ICA bulb measures 1.3 mm. Compared to a more distal cervical ICA diameter of 4 mm, this indicates a 70% stenosis.  Left carotid system: Common carotid artery shows diffuse atherosclerotic disease with narrowing throughout its course estimated at 30%. Atherosclerotic disease at the carotid bifurcation and ICA bulb. Minimal ICA bulb diameter is 2 mm. Compared to a more distal cervical ICA diameter of 5 mm, this indicates a 60% stenosis. Vertebral arteries:Vertebral artery origins cannot be evaluated because of shoulder density and venous  reflux. In the midcervical region, there is flow in both vertebral arteries, though the vessels show diffuse atherosclerotic narrowing and irregularity. Skeleton: Negative Other neck: No mass or lymphadenopathy. Upper chest: Lung apices are clear. CTA HEAD Anterior circulation: Severe atherosclerotic disease affecting the internal carotid arteries as they pass through the skullbase, this is particularly pronounced on the left with narrowing and irregularity in the carotid canal. Both carotid siphons show advanced atherosclerotic disease with calcification and narrowing. Stenoses in these regions are probably 70% or greater. The anterior and middle cerebral vessels are patent but show narrowing and irregularity bilaterally. A1 segment on the right is quite diminutive. No evidence of medium or large vessel intracranial occlusion. Distal vessel irregularity is present. Posterior circulation: Both vertebral arteries are patent through the foramen magnum. There is stenosis and irregularity of the distal vertebral arteries bilaterally. The proximal basilar artery is markedly stenotic. Vessel limb an in that region is 1 mm or less. Basilar artery shows diffuse atherosclerotic narrowing and irregularity. Flow is present in the superior cerebellar and posterior cerebral arteries bilaterally. PCAs receive most of there supply from the anterior circulation. Distal vessel irregularity is present. Venous sinuses: Patent and normal Anatomic variants: None significant Delayed phase: No abnormal enhancement IMPRESSION: Severe diffuse atherosclerotic disease. Vessels in the neck show circumferential intimal thickening. Atherosclerotic disease affecting the internal carotid arteries bilaterally. Stenosis on the right estimated at 70%. Stenosis on the left estimated at 60%. Severe atherosclerotic narrowing of the internal carotid arteries as they pass through the skullbase in the siphon regions. There are probable serial 70% stenoses  on both sides. Patient is certainly be at risk of internal carotid artery occlusion on either or both sides. Severe stenosis affecting the distal vertebral arteries and the proximal basilar artery. The patient would be at risk of posterior circulation occlusion. Distal vessel atherosclerotic irregularity diffusely consistent with intracranial medium to small-vessel atherosclerotic disease. Electronically Signed   By: Paulina FusiMark  Shogry M.D.   On: 07/17/2016 11:59   Ct Head Code Stroke W/o Cm  Result Date: 07/17/2016 CLINICAL DATA:  Code stroke. Slurred speech. Numbness of left face. EXAM: CT HEAD WITHOUT CONTRAST TECHNIQUE: Contiguous axial images were obtained from the base of the skull through the vertex without intravenous contrast. COMPARISON:  04/25/2015 FINDINGS: No hyperdense vessel, acute hemorrhage, or noted acute infarct. There is extensive for age patchy low-density in the cerebral white matter consistent with chronic small vessel ischemic gliosis. This has a stable pattern when compared to prior but could obscure a new white matter infarct. Remote lacunar infarct in the left thalamus and high left putamen. Remote small vessel infarcts in the right cerebellum. No hydrocephalus or mass lesion. Atherosclerotic calcification. ASPECTS ([place]berta Stroke Program Early CT Score) - Ganglionic level infarction (caudate, lentiform nuclei, internal capsule, insula, M1-M3 cortex): 7 - Supraganglionic infarction (M4-M6 cortex): 3 Total score (0-10 with 10 being normal): 10 Critical Value/emergent results were called by telephone at the time of interpretation on 07/17/2016 at 10:39 am to Dr. Sharman CheekPHILLIP STAFFORD , who verbally acknowledged these results. IMPRESSION: 1. No acute finding. ASPECTS is 10. 2. Advanced for age chronic  microvascular disease. Chronic changes could obscure a new white matter infarct. Electronically Signed   By: Marnee SpringJonathon  Watts M.D.   On: 07/17/2016 10:42     STUDIES:  CT head 8/15 >No acute  finding. Advanced for age chronic microvascular disease. Chronic changes could obscure a new white matter infarct. CTA head 8/15 > Severe diffuse atherosclerotic disease including vessels of neck, vertebral, and basilar artery.  CULTURES:   ANTIBIOTICS:   SIGNIFICANT EVENTS: 8/15 > CVA, Admit  LINES/TUBES:  I reviewed head CT myself, arthrosclerotic changes but no acute CVA noted.  ASSESSMENT / PLAN:  PULMONARY A: No acute disease. P:   - Monitor for airway protection. - Titrate O2 for sat of 88-92%. - IS per RT protocol. - Flutter valve.  CARDIOVASCULAR A:  Hypotension - likely due to nitro patch, seems that he was adequately fluid resuscitated.  Target MAP of 75. P:  - NS 100 ml/hr. - Remove nitropatch. - No anti-HTN. - Check after return from CXR and removal of nitro patch, if remains low will consider peripheral neo prior to having to place TLC.  RENAL A:   Metabolic acidosis - AG. CRI - unknown Cr base. P:   - Check urine for ketones. - No need to treat as DKA. - Check buteric acid. - IVF resuscitation. - Replace electrolytes as indicated. - BMET in AM.  GASTROINTESTINAL A:   No active issues. P:   - Protonix. - NPO>  HEMATOLOGIC A:   No active issues. P:  - CBC in AM. - Transfuse per ICU protocol.  INFECTIOUS A:   No active issues.  Procalcitonin of 0.23. P:   - Monitor WBC and fever curve.  ENDOCRINE A:   DM   P:   - R/U DKA. - Insulin drip. - ISS.  NEUROLOGIC A:   CVA P:   - S/P tPA. - per neurology. - Target MAP of 75 mmHg.  FAMILY  - Updates: Patient updated bedside.  - Inter-disciplinary family meet or Palliative Care meeting due by:  8/22  The patient is critically ill with multiple organ systems failure and requires high complexity decision making for assessment and support, frequent evaluation and titration of therapies, application of advanced monitoring technologies and extensive interpretation of multiple  databases.   Critical Care Time devoted to patient care services described in this note is  35  Minutes. This time reflects time of care of this signee Dr Koren BoundWesam Aolani Piggott. This critical care time does not reflect procedure time, or teaching time or supervisory time of PA/NP/Med student/Med Resident etc but could involve care discussion time.  Alyson ReedyWesam G. Franki Stemen, M.D. [facility]. Pager: [phone]. After hours pager: [phone].  07/17/2016, 4:23 PM 07/17/2016, 4:21 PM

## 2016-07-17 NOTE — ED Provider Notes (Addendum)
[facility] Emergency Department Provider Note  ____________________________________________  Time seen: Approximately 10:42 AM  I have reviewed the triage vital signs and the nursing notes.   HISTORY  Chief Complaint Code Stroke Left-sided weakness   HPI Alan Bryant is a 55 y.o. male who complains of left facial weakness and left arm numbness that started about 7:45 AM today. Also felt dizzy and off balance. No falls or head trauma. No vision changes. No thunderclap headache. No syncope or seizure. Has a history of stroke about 2 years ago, no history of intracranial hemorrhage. No history of brain surgery.  Patient has diabetes CAD, noncompliant with medications.     Past Medical History:  Diagnosis Date  . Blockage of coronary artery of heart (HCC)   . CVA (cerebral infarction)   . Diabetes mellitus without complication (HCC)   . Hypertension   . Neuropathy (HCC)   . PTSD (post-traumatic stress disorder)      There are no active problems to display for this patient.    Past Surgical History:  Procedure Laterality Date  . SHOULDER SURGERY       Prior to Admission medications   Medication Sig Start Date End Date Taking? Authorizing Provider  Aspirin-Salicylamide-Caffeine (BC HEADACHE) 325-95-16 MG TABS Take 1 packet by mouth daily as needed (for headache).   Yes Historical Provider, MD  atorvastatin (LIPITOR) 80 MG tablet Take 80 mg by mouth daily at 6 PM.   Yes Historical Provider, MD  gabapentin (NEURONTIN) 300 MG capsule Take 1,200 mg by mouth 2 (two) times daily.    Yes Historical Provider, MD  haloperidol (HALDOL) 1 MG tablet Take 1 mg by mouth at bedtime.   Yes Historical Provider, MD  hydrOXYzine (VISTARIL) 25 MG capsule Take 25 mg by mouth 2 (two) times daily as needed for anxiety.   Yes Historical Provider, MD  insulin aspart (NOVOLOG) 100 UNIT/ML injection Inject 10-40 Units into the skin 2 (two) times daily. 40 units in the  morning and 10 units at bedtime   Yes Historical Provider, MD  insulin glargine (LANTUS) 100 UNIT/ML injection Inject 25 Units into the skin at bedtime.   Yes Historical Provider, MD  lisinopril (PRINIVIL,ZESTRIL) 40 MG tablet Take 40 mg by mouth 2 (two) times daily.   Yes Historical Provider, MD  metoprolol tartrate (LOPRESSOR) 25 MG tablet Take 25 mg by mouth daily.   Yes Historical Provider, MD  nitroGLYCERIN (NITRODUR - DOSED IN MG/24 HR) 0.2 mg/hr patch Place 0.2 mg onto the skin daily.   Yes Historical Provider, MD  zolpidem (AMBIEN) 10 MG tablet Take 10 mg by mouth at bedtime as needed for sleep.   Yes Historical Provider, MD     Allergies Percocet [oxycodone-acetaminophen]   No family history on file.  Social History Social History  Substance Use Topics  . Smoking status: Never Smoker  . Smokeless tobacco: Not on file  . Alcohol use Yes    Review of Systems  Constitutional:   No fever or chills.  ENT:   No sore throat. No rhinorrhea. Cardiovascular:   No chest pain. Respiratory:   No dyspnea or cough. Gastrointestinal:   Negative for abdominal pain, vomiting and diarrhea.  Neurological:   Positive left arm numbness and left facial weakness 10-point ROS otherwise negative.  ____________________________________________   PHYSICAL EXAM:  VITAL SIGNS: ED Triage Vitals [07/17/16 1036]  Enc Vitals Group     BP      Pulse  Resp      Temp      Temp src      SpO2      Weight 234 lb 14.4 oz (106.5 kg)     Height      Head Circumference      Peak Flow      Pain Score      Pain Loc      Pain Edu?      Excl. in GC?     Vital signs reviewed, nursing assessments reviewed.   Constitutional:   Alert and oriented. Not in distress. Eyes:   No scleral icterus. No conjunctival pallor. PERRL. EOMI.  No nystagmus. ENT   Head:   Normocephalic and atraumatic.   Nose:   No congestion/rhinnorhea. No septal hematoma   Mouth/Throat:   MMM, no pharyngeal  erythema. No peritonsillar mass.    Neck:   No stridor. No SubQ emphysema. No meningismus. Hematological/Lymphatic/Immunilogical:   No cervical lymphadenopathy. Cardiovascular:   RRR. Symmetric bilateral radial and DP pulses.  No murmurs.  Respiratory:   Normal respiratory effort without tachypnea nor retractions. Breath sounds are clear and equal bilaterally. No wheezes/rales/rhonchi. Gastrointestinal:   Soft and nontender. Non distended. There is no CVA tenderness.  No rebound, rigidity, or guarding. Genitourinary:   deferred Musculoskeletal:   Nontender with normal range of motion in all extremities. No joint effusions.  No lower extremity tenderness.  No edema. Neurologic:   Dysarthria Left lower extremity dysmetria Image sensation left arm Left facial droop NIH stroke scale 7 Skin:    Skin is warm, dry and intact. No rash noted.  No petechiae, purpura, or bullae.  ____________________________________________    LABS (pertinent positives/negatives) (all labs ordered are listed, but only abnormal results are displayed) Labs Reviewed  CBC - Abnormal; Notable for the following:       Result Value   WBC 11.5 (*)    All other components within normal limits  DIFFERENTIAL - Abnormal; Notable for the following:    Neutro Abs 7.3 (*)    Basophils Absolute 0.2 (*)    All other components within normal limits  GLUCOSE, CAPILLARY - Abnormal; Notable for the following:    Glucose-Capillary 599 (*)    All other components within normal limits  GLUCOSE, CAPILLARY - Abnormal; Notable for the following:    Glucose-Capillary >600 (*)    All other components within normal limits  PROTIME-INR  APTT  COMPREHENSIVE METABOLIC PANEL  TROPONIN I  CBG MONITORING, ED   ____________________________________________   EKG  Interpreted by me Sinus rhythm rate of 72. Unable to assess Axis II likely limb lead reversal. Normal intervals. No acute ischemic changes. T-wave inversions, grossly  unchanged from previous EKG 04/25/2015.  ____________________________________________    RADIOLOGY  CT head unremarkable, discussed with radiologist CT angiogram head and neck Unremarkable  ____________________________________________   PROCEDURES Procedures CRITICAL CARE Performed by: Scotty CourtSTAFFORD, [place] Zou   Total critical care time: 35 minutes  Critical care time was exclusive of separately billable procedures and treating other patients.  Critical care was necessary to treat or prevent imminent or life-threatening deterioration.  Critical care was time spent personally by me on the following activities: development of treatment plan with patient and/or surrogate as well as nursing, discussions with consultants, evaluation of patient's response to treatment, examination of patient, obtaining history from patient or surrogate, ordering and performing treatments and interventions, ordering and review of laboratory studies, ordering and review of radiographic studies, pulse oximetry and re-evaluation of patient's  condition.   TPA administration Meets inclusion criteria No hard exclusion criteria.  Administered according to protocol with assistance from stroke coordinator.  ____________________________________________   INITIAL IMPRESSION / ASSESSMENT AND PLAN / ED COURSE  Pertinent labs & imaging results that were available during my care of the patient were reviewed by me and considered in my medical decision making (see chart for details).  Patient presents with multiple neurologic symptoms, consistent with acute ischemic stroke. Concern for posterior circulation involvement so the patient be sent back for CT angiogram. Neurology and stroke were nadir were present assessing the patient on my arrival to the room, discussed the case with them during their assessment as well, we agree the patient's presentation is consistent with ischemic stroke, is within the time window and a good  candidate for TPA. Patient verbally agrees to [facility] and transfer. TPA was initiated see flow sheet for further details. Case was discussed with Dr. Amada Jupiter of neurology at [facility], accepted to neuro ICU for further management. [facility] to transport.  Blood pressure has been borderline about 90/60. Patient's blood sugar is 600.  As part of his stroke care will need to give him IV insulin to help lower his blood glucose and bring him under control. However, this poses the risk of further lowering his blood pressures are giving him IV fluids first to ensure that his blood pressure may stable and then will proceed with insulin therapy to help control the blood sugars.     Clinical Course  Value Comment By Time  Sodium: (!) 126 (Reviewed) Sharman Cheek, MD 08/15 1127  Troponin I: (!!) 0.06 (Reviewed) Sharman Cheek, MD 08/15 1128  Glucose: (!!) 584 Multiple lab abnormalities all related to the effects of acute stroke as well as uncontrolled diabetes with significant dehydration. Continue IV saline. IV insulin ordered for glucose control as well. Awaiting transport to receiving Hospital. Sharman Cheek, MD 08/15 1128    ----------------------------------------- 12:03 PM on 07/17/2016 -----------------------------------------  [facility] is here to transfer the patient. Because insulin had not arrived and nurse was busy with other patients, insulin was not able to be given prior to transport. He did receive IV fluids which should help improve his glucose and blood pressure. Nurse signed out to receiving that insulin was not given. ____________________________________________   FINAL CLINICAL IMPRESSION(S) / ED DIAGNOSES  Final diagnoses:  Cerebral infarction due to unspecified mechanism       Portions of this note were generated with dragon dictation software. Dictation errors may occur despite best attempts at proofreading.    Sharman Cheek, MD 07/17/16 1610    Sharman Cheek, MD 07/17/16 1226

## 2016-07-17 NOTE — Consult Note (Signed)
Referring Physician: Scotty CourtStafford    Chief Complaint: Ataxia, difficulty with speech  HPI: Alan Bryant is an 55 y.o. male with a history of CAD, DM, poorly compliant with medications who reports that he awakened today not feeling well but otherwise at baseline.  Went to work with his son and on getting out of the truck was off balance and dizzy.  They continued to try to work and son noted when he got down off the roof that his father had slurred speech and some facial droop.  Patient was brought in for evaluation at that time.  Initial NIHSS of 7.    Date last known well: Date: 07/17/2016 Time last known well: Time: 07:45 tPA Given: Yes  MRankin: 0  Past Medical History:  Diagnosis Date  . Blockage of coronary artery of heart (HCC)   . CVA (cerebral infarction)   . Diabetes mellitus without complication (HCC)   . Hypertension   . Neuropathy (HCC)   . PTSD (post-traumatic stress disorder)     Past Surgical History:  Procedure Laterality Date  . SHOULDER SURGERY      Family history: Father alive with DM.  Mother deceased from cancer, unknown site   Social History:  reports that he has never smoked. He does not have any smokeless tobacco history on file. He reports that he drinks alcohol. He reports that he uses drugs, including Marijuana.  Allergies:  Allergies  Allergen Reactions  . Percocet [Oxycodone-Acetaminophen] Hives and Itching    Medications: I have reviewed the patient's current medications. Prior to Admission:  Prior to Admission medications   Medication Sig Start Date End Date Taking? Authorizing Provider  Aspirin-Salicylamide-Caffeine (BC HEADACHE) 325-95-16 MG TABS Take 1 packet by mouth daily as needed (for headache).   Yes Historical Provider, MD  atorvastatin (LIPITOR) 80 MG tablet Take 80 mg by mouth daily at 6 PM.   Yes Historical Provider, MD  gabapentin (NEURONTIN) 300 MG capsule Take 1,200 mg by mouth 2 (two) times daily.    Yes Historical Provider, MD   haloperidol (HALDOL) 1 MG tablet Take 1 mg by mouth at bedtime.   Yes Historical Provider, MD  hydrOXYzine (VISTARIL) 25 MG capsule Take 25 mg by mouth 2 (two) times daily as needed for anxiety.   Yes Historical Provider, MD  insulin aspart (NOVOLOG) 100 UNIT/ML injection Inject 10-40 Units into the skin 2 (two) times daily. 40 units in the morning and 10 units at bedtime   Yes Historical Provider, MD  insulin glargine (LANTUS) 100 UNIT/ML injection Inject 25 Units into the skin at bedtime.   Yes Historical Provider, MD  lisinopril (PRINIVIL,ZESTRIL) 40 MG tablet Take 40 mg by mouth 2 (two) times daily.   Yes Historical Provider, MD  metoprolol tartrate (LOPRESSOR) 25 MG tablet Take 25 mg by mouth daily.   Yes Historical Provider, MD  nitroGLYCERIN (NITRODUR - DOSED IN MG/24 HR) 0.2 mg/hr patch Place 0.2 mg onto the skin daily.   Yes Historical Provider, MD  zolpidem (AMBIEN) 10 MG tablet Take 10 mg by mouth at bedtime as needed for sleep.   Yes Historical Provider, MD    ROS: History obtained from the patient  General ROS: negative for - chills, fatigue, fever, night sweats, weight gain or weight loss Psychological ROS: depression Ophthalmic ROS: negative for - blurry vision, double vision, eye pain or loss of vision ENT ROS: negative for - epistaxis, nasal discharge, oral lesions, sore throat, tinnitus or vertigo Allergy and Immunology ROS: negative  for - hives or itchy/watery eyes Hematological and Lymphatic ROS: negative for - bleeding problems, bruising or swollen lymph nodes Endocrine ROS: negative for - galactorrhea, hair pattern changes, polydipsia/polyuria or temperature intolerance Respiratory ROS: negative for - cough, hemoptysis, shortness of breath or wheezing Cardiovascular ROS: negative for - chest pain, dyspnea on exertion, edema or irregular heartbeat Gastrointestinal ROS: negative for - abdominal pain, diarrhea, hematemesis, nausea/vomiting or stool  incontinence Genito-Urinary ROS: negative for - dysuria, hematuria, incontinence or urinary frequency/urgency Musculoskeletal ROS: leg numbness Neurological ROS: as noted in HPI Dermatological ROS: negative for rash and skin lesion changes  Physical Examination: Blood pressure 95/70, pulse 72, temperature 97.7 F (36.5 C), temperature source Oral, resp. rate 17, weight 106.5 kg (234 lb 14.4 oz), SpO2 97 %.  HEENT-  Normocephalic, no lesions, without obvious abnormality.  Normal external eye and conjunctiva.  Normal TM's bilaterally.  Normal auditory canals and external ears. Normal external nose, mucus membranes and septum.  Normal pharynx. Cardiovascular- S1, S2 normal, pulses palpable throughout   Lungs- chest clear, no wheezing, rales, normal symmetric air entry Abdomen- soft, non-tender; bowel sounds normal; no masses,  no organomegaly Extremities- no edema Lymph-no adenopathy palpable Musculoskeletal-no joint tenderness, deformity or swelling Skin-warm and dry, no hyperpigmentation, vitiligo, or suspicious lesions  Neurological Examination Mental Status: Alert, oriented, thought content appropriate.  Speech fluent without evidence of aphasia.  Dysarthric.  Able to follow 3 step commands without difficulty. Cranial Nerves: II: Discs flat bilaterally; Decreased left peripheral vision from the left eye, pupils equal, round, reactive to light and accommodation III,IV, VI: ptosis not present, extra-ocular motions intact bilaterally V,VII: mild left facial droop, facial light touch sensation decreased on the left VIII: hearing normal bilaterally IX,X: gag reflex present XI: bilateral shoulder shrug XII: midline tongue extension Motor: Right : Upper extremity   5/5    Left:     Upper extremity   5-/5  Lower extremity   5/5     Lower extremity   5-/5 Tone and bulk:normal tone throughout; no atrophy noted Sensory: Pinprick and light touch decreased in the LUE Deep Tendon Reflexes: 1+  in the upper extremities, absent in the lower extremities Plantars: Right: mute   Left: mute Cerebellar: Normal finger-to-nose testing bilaterally, dysmetria with heel-to-shin testing on the left Gait: not tested due to safety concerns   Laboratory Studies:  Basic Metabolic Panel:  Recent Labs Lab 07/17/16 1032  NA 126*  K 4.0  CL 91*  CO2 19*  GLUCOSE 584*  BUN 31*  CREATININE 2.66*  CALCIUM 9.3    Liver Function Tests:  Recent Labs Lab 07/17/16 1032  AST 37  ALT 41  ALKPHOS 108  BILITOT 0.7  PROT 6.9  ALBUMIN 3.8   No results for input(s): LIPASE, AMYLASE in the last 168 hours. No results for input(s): AMMONIA in the last 168 hours.  CBC:  Recent Labs Lab 07/17/16 1032  WBC 11.5*  NEUTROABS 7.3*  HGB 15.9  HCT 46.8  MCV 84.6  PLT 278    Cardiac Enzymes:  Recent Labs Lab 07/17/16 1032  TROPONINI 0.06*    BNP: Invalid input(s): POCBNP  CBG:  Recent Labs Lab 07/17/16 1042 07/17/16 1044  GLUCAP 599* >600*    Microbiology: Results for orders placed or performed in visit on 05/03/13  Culture, blood (single)     Status: None   Collection Time: 05/03/13  9:37 PM  Result Value Ref Range Status   Micro Text Report   Final  COMMENT                   NO GROWTH AEROBICALLY/ANAEROBICALLY IN 5 DAYS   ANTIBIOTIC                                                      Culture, blood (single)     Status: None   Collection Time: 05/03/13  9:41 PM  Result Value Ref Range Status   Micro Text Report   Final       COMMENT                   NO GROWTH AEROBICALLY/ANAEROBICALLY IN 5 DAYS   ANTIBIOTIC                                                        Coagulation Studies:  Recent Labs  07/17/16 1032  LABPROT 12.5  INR 0.93    Urinalysis: No results for input(s): COLORURINE, LABSPEC, PHURINE, GLUCOSEU, HGBUR, BILIRUBINUR, KETONESUR, PROTEINUR, UROBILINOGEN, NITRITE, LEUKOCYTESUR in the last 168 hours.  Invalid input(s):  APPERANCEUR  Lipid Panel: No results found for: CHOL, TRIG, HDL, CHOLHDL, VLDL, LDLCALC  HgbA1C: No results found for: HGBA1C  Urine Drug Screen:      Component Value Date/Time   LABOPIA NONE DETECTED 12/01/2009 2207   COCAINSCRNUR POSITIVE (A) 12/01/2009 2207   LABBENZ NONE DETECTED 12/01/2009 2207   AMPHETMU NONE DETECTED 12/01/2009 2207   THCU NONE DETECTED 12/01/2009 2207   LABBARB  12/01/2009 2207    NONE DETECTED        DRUG SCREEN FOR MEDICAL PURPOSES ONLY.  IF CONFIRMATION IS NEEDED FOR ANY PURPOSE, NOTIFY LAB WITHIN 5 DAYS.        LOWEST DETECTABLE LIMITS FOR URINE DRUG SCREEN Drug Class       Cutoff (ng/mL) Amphetamine      1000 Barbiturate      200 Benzodiazepine   200 Tricyclics       300 Opiates          300 Cocaine          300 THC              50    Alcohol Level: No results for input(s): ETH in the last 168 hours.  Other results: EKG: sinus rhythm at 72 bpm.  Imaging: Ct Head Code Stroke W/o Cm  Result Date: 07/17/2016 CLINICAL DATA:  Code stroke. Slurred speech. Numbness of left face. EXAM: CT HEAD WITHOUT CONTRAST TECHNIQUE: Contiguous axial images were obtained from the base of the skull through the vertex without intravenous contrast. COMPARISON:  04/25/2015 FINDINGS: No hyperdense vessel, acute hemorrhage, or noted acute infarct. There is extensive for age patchy low-density in the cerebral white matter consistent with chronic small vessel ischemic gliosis. This has a stable pattern when compared to prior but could obscure a new white matter infarct. Remote lacunar infarct in the left thalamus and high left putamen. Remote small vessel infarcts in the right cerebellum. No hydrocephalus or mass lesion. Atherosclerotic calcification. ASPECTS [facility] Stroke Program Early CT Score) - Ganglionic level infarction (caudate, lentiform nuclei, internal capsule, insula, M1-M3 cortex): 7 -  Supraganglionic infarction (M4-M6 cortex): 3 Total score (0-10 with 10 being  normal): 10 Critical Value/emergent results were called by telephone at the time of interpretation on 07/17/2016 at 10:39 am to Dr. Sharman Cheek , who verbally acknowledged these results. IMPRESSION: 1. No acute finding. ASPECTS is 10. 2. Advanced for age chronic microvascular disease. Chronic changes could obscure a new white matter infarct. Electronically Signed   By: Marnee Spring M.D.   On: 07/17/2016 10:42    Assessment: 55 y.o. male presenting with left hemiparesis, dysarthria and ataxia.  Patient with history of stroke in the past.  Noncompliant with medications.  Head CT personally reviewed and shows no acute changes.  Initial NIHSS of 7.  Contraindications of tPA reviewed.  Risks and benefits discussed with patient and son.  Verbal consent obtained.  tPA administered.  Patient to be scheduled for transfer to neuro ICU at [facility].  Transfer discussed with Dr. Amada Jupiter.    Stroke Risk Factors - diabetes mellitus and hypertension  Plan: 1. CTA of the head and neck to be performed STAT to rule out posterior circulation thrombus that may be amenable to further intervention.   2. MRI of the brain without contrast 3. PT consult, OT consult, Speech consult 4. Echocardiogram 5. HgbA1c, fasting lipid panel 6. Prophylactic therapy-None 7. NPO until RN stroke swallow screen 8. Telemetry monitoring 9. Frequent neuro checks 10. NS bolus for BP 11. Blood sugar management  This patient is critically ill and at significant risk of neurological worsening, death and care requires constant monitoring of vital signs, hemodynamics,respiratory and cardiac monitoring, neurological assessment, discussion with family, other specialists and medical decision making of high complexity. I spent 60 minutes of neurocritical care time  in the care of  this patient.  Thana Farr, MD Neurology [phone] 07/17/2016  11:24 AM

## 2016-07-17 NOTE — ED Notes (Signed)
Pt to CT

## 2016-07-17 NOTE — ED Notes (Signed)
Gave 9mg  loading dose then 81mg  for total f 90mg  over one hour.

## 2016-07-17 NOTE — H&P (Addendum)
History and physical    HPI:                                                                                                                                         Alan Bryant is an 55 y.o. male male with a history of CAD, DM, poorly compliant with medications who reports that he awakened today not feeling well but otherwise at baseline.  Went to work with his son and on getting out of the truck was off balance and dizzy.  They continued to try to work and son noted when he got down off the roof that his father had slurred speech and some facial droop.  Patient was brought in to [facility] ER  for evaluation at that time.  Initial NIHSS of 7. Patient was given TPA and then transferred to [facility] for further management. On arrival patient was awake and alert and able to state that he was at [facility]. He continued to have a left facial droop, left arm drift, and left leg drift. In route he remained hypotensive and required a total of 4 L of normal saline. On arrival to [facility] ICU patient's blood pressure was 103/81.  Bladder scan revealed 530 mL of urine in the bladder.     Date last known well: Date: 07/17/2016 Time last known well: Time: 07:45 tPA Given: Yes  MRankin: 0   Past Medical History:  Diagnosis Date  . Blockage of coronary artery of heart (HCC)   . CVA (cerebral infarction)   . Diabetes mellitus without complication (HCC)   . Hypertension   . Neuropathy (HCC)   . PTSD (post-traumatic stress disorder)     Past Surgical History:  Procedure Laterality Date  . SHOULDER SURGERY      Family History  Problem Relation Age of Onset  . Hypertension Mother   . Hypertension Father    Social History:  reports that he has never smoked. He does not have any smokeless tobacco history on file. He reports that he drinks alcohol. He reports that he uses drugs, including Marijuana.  Allergies:  Allergies  Allergen Reactions  . Percocet [Oxycodone-Acetaminophen] Hives and  Itching    Medications:  Prior to Admission:  Prescriptions Prior to Admission  Medication Sig Dispense Refill Last Dose  . Aspirin-Salicylamide-Caffeine (BC HEADACHE) 325-95-16 MG TABS Take 1 packet by mouth daily as needed (for headache).   prn at prn  . atorvastatin (LIPITOR) 80 MG tablet Take 80 mg by mouth daily at 6 PM.   07/16/2016 at Unknown time  . gabapentin (NEURONTIN) 300 MG capsule Take 1,200 mg by mouth 2 (two) times daily.    07/17/2016 at 0700  . haloperidol (HALDOL) 1 MG tablet Take 1 mg by mouth at bedtime.   07/16/2016 at Unknown time  . hydrOXYzine (VISTARIL) 25 MG capsule Take 25 mg by mouth 2 (two) times daily as needed for anxiety.   prn at prn  . insulin aspart (NOVOLOG) 100 UNIT/ML injection Inject 10-40 Units into the skin 2 (two) times daily. 40 units in the morning and 10 units at bedtime   07/17/2016 at 0700  . insulin glargine (LANTUS) 100 UNIT/ML injection Inject 25 Units into the skin at bedtime.   07/16/2016 at Unknown time  . lisinopril (PRINIVIL,ZESTRIL) 40 MG tablet Take 40 mg by mouth 2 (two) times daily.   07/17/2016 at 0700  . metoprolol tartrate (LOPRESSOR) 25 MG tablet Take 25 mg by mouth daily.   07/17/2016 at 0700  . nitroGLYCERIN (NITRODUR - DOSED IN MG/24 HR) 0.2 mg/hr patch Place 0.2 mg onto the skin daily.   unknown at unknown  . zolpidem (AMBIEN) 10 MG tablet Take 10 mg by mouth at bedtime as needed for sleep.   07/16/2016 at Unknown time   Scheduled: .  stroke: mapping our early stages of recovery book   Does not apply Once  . atorvastatin  80 mg Oral q1800  . gabapentin  1,200 mg Oral BID  . insulin glargine  25 Units Subcutaneous QHS  . pantoprazole  40 mg Oral Daily    ROS:                                                                                                                                       History obtained  from the patient  General ROS: negative for - chills, fatigue, fever, night sweats, weight gain or weight loss Psychological ROS: negative for - behavioral disorder, hallucinations, memory difficulties, mood swings or suicidal ideation Ophthalmic ROS: negative for - blurry vision, double vision, eye pain or loss of vision ENT ROS: negative for - epistaxis, nasal discharge, oral lesions, sore throat, tinnitus or vertigo Allergy and Immunology ROS: negative for - hives or itchy/watery eyes Hematological and Lymphatic ROS: negative for - bleeding problems, bruising or swollen lymph nodes Endocrine ROS: negative for - galactorrhea, hair pattern changes, polydipsia/polyuria or temperature intolerance Respiratory ROS: negative for - cough, hemoptysis, shortness of breath or wheezing Cardiovascular ROS: negative for - chest pain, dyspnea on exertion, edema or irregular heartbeat Gastrointestinal ROS: negative for - abdominal pain, diarrhea, hematemesis,  nausea/vomiting or stool incontinence Genito-Urinary ROS: negative for - dysuria, hematuria, incontinence or urinary frequency/urgency Musculoskeletal ROS: Positive for -Left-sided numbness and weakness Neurological ROS: as noted in HPI Dermatological ROS: negative for rash and skin lesion changes  Neurologic Examination:                                                                                                      Blood pressure 100/81, pulse 76, resp. rate 20, SpO2 99 %.  HEENT-  Normocephalic, no lesions, without obvious abnormality.  Normal external eye and conjunctiva.  Normal TM's bilaterally.  Normal auditory canals and external ears. Normal external nose, mucus membranes and septum.  Normal pharynx. Cardiovascular- S1, S2 normal, pulses palpable throughout   Lungs- chest clear, no wheezing, rales, normal symmetric air entry Abdomen- Distended with bowel sounds in all 4 quadrants and no discomfort. Extremities- no joint deformities,  effusion, or inflammation Lymph-no adenopathy palpable Musculoskeletal-no joint tenderness, deformity or swelling Skin-warm and dry, no hyperpigmentation, vitiligo, or suspicious lesions  Neurological Examination Mental Status: Alert, oriented, thought content appropriate.  Speech dysarthric without evidence of aphasia.  Able to follow 3 step commands without difficulty. Cranial Nerves: II:  Visual fields grossly normal, pupils 3 mm equal, round, reactive to light and accommodation III,IV, VI: ptosis not present, extra-ocular motions intact bilaterally V,VII: smile asymmetric with left facial droop, facial light touch decreased on the left lower face VIII: hearing normal bilaterally IX,X: uvula rises symmetrically XI: bilateral shoulder shrug XII: midline tongue extension Motor: Right : Upper extremity   5/5    Left:     Upper extremity   4+/5  Lower extremity   5/5     Lower extremity   4+/5 --Drift in both left upper and lower extremity Tone and bulk:normal tone throughout; no atrophy noted Sensory: Decreased in left upper extremity Deep Tendon Reflexes: 1+ and symmetric throughout upper extremities with no ankle jerks or knee jerks. Plantars: Mute bilaterally Cerebellar: Slight left dysmetria with finger-to-nose and normal heel-to-shin test Gait: Not tested        Lab Results: Basic Metabolic Panel:  Recent Labs Lab 07/17/16 1032  NA 126*  K 4.0  CL 91*  CO2 19*  GLUCOSE 584*  BUN 31*  CREATININE 2.66*  CALCIUM 9.3    Liver Function Tests:  Recent Labs Lab 07/17/16 1032  AST 37  ALT 41  ALKPHOS 108  BILITOT 0.7  PROT 6.9  ALBUMIN 3.8   No results for input(s): LIPASE, AMYLASE in the last 168 hours. No results for input(s): AMMONIA in the last 168 hours.  CBC:  Recent Labs Lab 07/17/16 1032  WBC 11.5*  NEUTROABS 7.3*  HGB 15.9  HCT 46.8  MCV 84.6  PLT 278    Cardiac Enzymes:  Recent Labs Lab 07/17/16 1032  TROPONINI 0.06*    Lipid  Panel: No results for input(s): CHOL, TRIG, HDL, CHOLHDL, VLDL, LDLCALC in the last 168 hours.  CBG:  Recent Labs Lab 07/17/16 1042 07/17/16 1044 07/17/16 1246  GLUCAP 599* >600* 511*    Microbiology: Results for  orders placed or performed in visit on 05/03/13  Culture, blood (single)     Status: None   Collection Time: 05/03/13  9:37 PM  Result Value Ref Range Status   Micro Text Report   Final       COMMENT                   NO GROWTH AEROBICALLY/ANAEROBICALLY IN 5 DAYS   ANTIBIOTIC                                                      Culture, blood (single)     Status: None   Collection Time: 05/03/13  9:41 PM  Result Value Ref Range Status   Micro Text Report   Final       COMMENT                   NO GROWTH AEROBICALLY/ANAEROBICALLY IN 5 DAYS   ANTIBIOTIC                                                        Coagulation Studies:  Recent Labs  07/17/16 1032  LABPROT 12.5  INR 0.93    Imaging: Ct Angio Head W Or Wo Contrast  Result Date: 07/17/2016 CLINICAL DATA:  Emergency presentation with slurred speech and left facial droop. EXAM: CT ANGIOGRAPHY HEAD AND NECK TECHNIQUE: Multidetector CT imaging of the head and neck was performed using the standard protocol during bolus administration of intravenous contrast. Multiplanar CT image reconstructions and MIPs were obtained to evaluate the vascular anatomy. Carotid stenosis measurements (when applicable) are obtained utilizing NASCET criteria, using the distal internal carotid diameter as the denominator. CONTRAST:  75 cc Isovue 370 COMPARISON:  Head CT earlier today. FINDINGS: CT HEAD The brain does not show advanced generalized atrophy. There are old small vessel cerebellar infarctions on the right. Cerebral hemispheres show chronic small-vessel ischemic changes of the white matter. No identifiable acute infarction. No hemorrhage, mass effect, hydrocephalus or extra-axial collection. There is atherosclerotic  calcification of the major vessels at the base of the brain. CTA NECK Aortic arch: Aortic atherosclerosis. No brachiocephalic vessel origin stenosis. Common origin of the innominate artery and left common carotid artery. Right carotid system: Diffuse atherosclerotic disease of the common carotid artery with narrowing throughout the course of 30-40%. Atherosclerotic disease at the carotid bifurcation and ICA bulb. Minimal diameter in the ICA bulb measures 1.3 mm. Compared to a more distal cervical ICA diameter of 4 mm, this indicates a 70% stenosis. Left carotid system: Common carotid artery shows diffuse atherosclerotic disease with narrowing throughout its course estimated at 30%. Atherosclerotic disease at the carotid bifurcation and ICA bulb. Minimal ICA bulb diameter is 2 mm. Compared to a more distal cervical ICA diameter of 5 mm, this indicates a 60% stenosis. Vertebral arteries:Vertebral artery origins cannot be evaluated because of shoulder density and venous reflux. In the midcervical region, there is flow in both vertebral arteries, though the vessels show diffuse atherosclerotic narrowing and irregularity. Skeleton: Negative Other neck: No mass or lymphadenopathy. Upper chest: Lung apices are clear. CTA HEAD Anterior circulation: Severe atherosclerotic disease affecting the internal  carotid arteries as they pass through the skullbase, this is particularly pronounced on the left with narrowing and irregularity in the carotid canal. Both carotid siphons show advanced atherosclerotic disease with calcification and narrowing. Stenoses in these regions are probably 70% or greater. The anterior and middle cerebral vessels are patent but show narrowing and irregularity bilaterally. A1 segment on the right is quite diminutive. No evidence of medium or large vessel intracranial occlusion. Distal vessel irregularity is present. Posterior circulation: Both vertebral arteries are patent through the foramen magnum.  There is stenosis and irregularity of the distal vertebral arteries bilaterally. The proximal basilar artery is markedly stenotic. Vessel limb an in that region is 1 mm or less. Basilar artery shows diffuse atherosclerotic narrowing and irregularity. Flow is present in the superior cerebellar and posterior cerebral arteries bilaterally. PCAs receive most of there supply from the anterior circulation. Distal vessel irregularity is present. Venous sinuses: Patent and normal Anatomic variants: None significant Delayed phase: No abnormal enhancement IMPRESSION: Severe diffuse atherosclerotic disease. Vessels in the neck show circumferential intimal thickening. Atherosclerotic disease affecting the internal carotid arteries bilaterally. Stenosis on the right estimated at 70%. Stenosis on the left estimated at 60%. Severe atherosclerotic narrowing of the internal carotid arteries as they pass through the skullbase in the siphon regions. There are probable serial 70% stenoses on both sides. Patient is certainly be at risk of internal carotid artery occlusion on either or both sides. Severe stenosis affecting the distal vertebral arteries and the proximal basilar artery. The patient would be at risk of posterior circulation occlusion. Distal vessel atherosclerotic irregularity diffusely consistent with intracranial medium to small-vessel atherosclerotic disease. Electronically Signed   By: Paulina Fusi M.D.   On: 07/17/2016 11:59   Ct Angio Neck W Or Wo Contrast  Result Date: 07/17/2016 CLINICAL DATA:  Emergency presentation with slurred speech and left facial droop. EXAM: CT ANGIOGRAPHY HEAD AND NECK TECHNIQUE: Multidetector CT imaging of the head and neck was performed using the standard protocol during bolus administration of intravenous contrast. Multiplanar CT image reconstructions and MIPs were obtained to evaluate the vascular anatomy. Carotid stenosis measurements (when applicable) are obtained utilizing NASCET  criteria, using the distal internal carotid diameter as the denominator. CONTRAST:  75 cc Isovue 370 COMPARISON:  Head CT earlier today. FINDINGS: CT HEAD The brain does not show advanced generalized atrophy. There are old small vessel cerebellar infarctions on the right. Cerebral hemispheres show chronic small-vessel ischemic changes of the white matter. No identifiable acute infarction. No hemorrhage, mass effect, hydrocephalus or extra-axial collection. There is atherosclerotic calcification of the major vessels at the base of the brain. CTA NECK Aortic arch: Aortic atherosclerosis. No brachiocephalic vessel origin stenosis. Common origin of the innominate artery and left common carotid artery. Right carotid system: Diffuse atherosclerotic disease of the common carotid artery with narrowing throughout the course of 30-40%. Atherosclerotic disease at the carotid bifurcation and ICA bulb. Minimal diameter in the ICA bulb measures 1.3 mm. Compared to a more distal cervical ICA diameter of 4 mm, this indicates a 70% stenosis. Left carotid system: Common carotid artery shows diffuse atherosclerotic disease with narrowing throughout its course estimated at 30%. Atherosclerotic disease at the carotid bifurcation and ICA bulb. Minimal ICA bulb diameter is 2 mm. Compared to a more distal cervical ICA diameter of 5 mm, this indicates a 60% stenosis. Vertebral arteries:Vertebral artery origins cannot be evaluated because of shoulder density and venous reflux. In the midcervical region, there is flow in both vertebral arteries,  though the vessels show diffuse atherosclerotic narrowing and irregularity. Skeleton: Negative Other neck: No mass or lymphadenopathy. Upper chest: Lung apices are clear. CTA HEAD Anterior circulation: Severe atherosclerotic disease affecting the internal carotid arteries as they pass through the skullbase, this is particularly pronounced on the left with narrowing and irregularity in the carotid  canal. Both carotid siphons show advanced atherosclerotic disease with calcification and narrowing. Stenoses in these regions are probably 70% or greater. The anterior and middle cerebral vessels are patent but show narrowing and irregularity bilaterally. A1 segment on the right is quite diminutive. No evidence of medium or large vessel intracranial occlusion. Distal vessel irregularity is present. Posterior circulation: Both vertebral arteries are patent through the foramen magnum. There is stenosis and irregularity of the distal vertebral arteries bilaterally. The proximal basilar artery is markedly stenotic. Vessel limb an in that region is 1 mm or less. Basilar artery shows diffuse atherosclerotic narrowing and irregularity. Flow is present in the superior cerebellar and posterior cerebral arteries bilaterally. PCAs receive most of there supply from the anterior circulation. Distal vessel irregularity is present. Venous sinuses: Patent and normal Anatomic variants: None significant Delayed phase: No abnormal enhancement IMPRESSION: Severe diffuse atherosclerotic disease. Vessels in the neck show circumferential intimal thickening. Atherosclerotic disease affecting the internal carotid arteries bilaterally. Stenosis on the right estimated at 70%. Stenosis on the left estimated at 60%. Severe atherosclerotic narrowing of the internal carotid arteries as they pass through the skullbase in the siphon regions. There are probable serial 70% stenoses on both sides. Patient is certainly be at risk of internal carotid artery occlusion on either or both sides. Severe stenosis affecting the distal vertebral arteries and the proximal basilar artery. The patient would be at risk of posterior circulation occlusion. Distal vessel atherosclerotic irregularity diffusely consistent with intracranial medium to small-vessel atherosclerotic disease. Electronically Signed   By: Paulina Fusi M.D.   On: 07/17/2016 11:59   Ct Head  Code Stroke W/o Cm  Result Date: 07/17/2016 CLINICAL DATA:  Code stroke. Slurred speech. Numbness of left face. EXAM: CT HEAD WITHOUT CONTRAST TECHNIQUE: Contiguous axial images were obtained from the base of the skull through the vertex without intravenous contrast. COMPARISON:  04/25/2015 FINDINGS: No hyperdense vessel, acute hemorrhage, or noted acute infarct. There is extensive for age patchy low-density in the cerebral white matter consistent with chronic small vessel ischemic gliosis. This has a stable pattern when compared to prior but could obscure a new white matter infarct. Remote lacunar infarct in the left thalamus and high left putamen. Remote small vessel infarcts in the right cerebellum. No hydrocephalus or mass lesion. Atherosclerotic calcification. ASPECTS ([place]ta Stroke Program Early CT Score) - Ganglionic level infarction (caudate, lentiform nuclei, internal capsule, insula, M1-M3 cortex): 7 - Supraganglionic infarction (M4-M6 cortex): 3 Total score (0-10 with 10 being normal): 10 Critical Value/emergent results were called by telephone at the time of interpretation on 07/17/2016 at 10:39 am to Dr. Sharman Cheek , who verbally acknowledged these results. IMPRESSION: 1. No acute finding. ASPECTS is 10. 2. Advanced for age chronic microvascular disease. Chronic changes could obscure a new white matter infarct. Electronically Signed   By: Marnee Spring M.D.   On: 07/17/2016 10:42       Assessment and plan discussed with with attending physician and they are in agreement.    Felicie Morn PA-C [facility] [phone]  07/17/2016, 1:06 PM   Assessment: 55 y.o. male 55 year old male presenting to [facility] with left hemiparesis dysarthria  and ataxia. Patient was brought in as a code stroke. Initial head CT was negative for acute bleed. Initial NIH scale was 7. CTA of head and neck was performed showing no posterior circulation thrombus that could have  been a amendable to further intervention. Aspects score was 10 Patient was administered TPA at [facility] and transferred to [facility] ICU.   He received a total of 4 L normal saline with persistent hypotension following this. No evidence of bleeding. Given the unclear etiology as well as out-of-control blood glucoses, I have asked for CCM assistance.  Stroke Risk Factors - diabetes mellitus and hypertension  Plan:  1 MRI of the brain without contrast 2. PT consult, OT consult, Speech consult 3. Echocardiogram 4. HgbA1c, fasting lipid panel 5. Prophylactic therapy-None 6 NPO until RN stroke swallow screen 7. Telemetry monitoring 8. Frequent neuro checks 9. NS bolus for BP 10. Blood sugar management--per phase 2 glycemic control protocol 11. Stroke team to follow in the morning on 07/18/2016 12. Appreciate CCM assistance.  This patient is critically ill and at significant risk of neurological worsening, death and care requires constant monitoring of vital signs, hemodynamics,respiratory and cardiac monitoring, neurological assessment, discussion with family, other specialists and medical decision making of high complexity. I spent 50 minutes of neurocritical care time  in the care of  this patient.  Ritta Slot, MD [facility] [phone]  If 7pm- 7am, please page neurology on call as listed in AMION. 07/17/2016  4:44 PM

## 2016-07-17 NOTE — ED Notes (Signed)
Alteplase completed at 1152.

## 2016-07-17 NOTE — ED Triage Notes (Signed)
Pt to ED with slurred speech and left sided facial droop. LKW was 0830. Son also reports pt has been off balance since 0745.

## 2016-07-18 ENCOUNTER — Inpatient Hospital Stay (HOSPITAL_COMMUNITY): Payer: Non-veteran care

## 2016-07-18 DIAGNOSIS — E871 Hypo-osmolality and hyponatremia: Secondary | ICD-10-CM

## 2016-07-18 DIAGNOSIS — F1410 Cocaine abuse, uncomplicated: Secondary | ICD-10-CM

## 2016-07-18 DIAGNOSIS — E1310 Other specified diabetes mellitus with ketoacidosis without coma: Secondary | ICD-10-CM

## 2016-07-18 DIAGNOSIS — I10 Essential (primary) hypertension: Secondary | ICD-10-CM

## 2016-07-18 DIAGNOSIS — Z720 Tobacco use: Secondary | ICD-10-CM

## 2016-07-18 DIAGNOSIS — Z794 Long term (current) use of insulin: Secondary | ICD-10-CM

## 2016-07-18 DIAGNOSIS — E1159 Type 2 diabetes mellitus with other circulatory complications: Secondary | ICD-10-CM

## 2016-07-18 DIAGNOSIS — E785 Hyperlipidemia, unspecified: Secondary | ICD-10-CM

## 2016-07-18 DIAGNOSIS — I6330 Cerebral infarction due to thrombosis of unspecified cerebral artery: Secondary | ICD-10-CM

## 2016-07-18 LAB — CBC
HEMATOCRIT: 37.7 % — AB (ref 39.0–52.0)
HEMOGLOBIN: 12.3 g/dL — AB (ref 13.0–17.0)
MCH: 28.3 pg (ref 26.0–34.0)
MCHC: 32.6 g/dL (ref 30.0–36.0)
MCV: 86.7 fL (ref 78.0–100.0)
Platelets: 239 10*3/uL (ref 150–400)
RBC: 4.35 MIL/uL (ref 4.22–5.81)
RDW: 12.6 % (ref 11.5–15.5)
WBC: 8.9 10*3/uL (ref 4.0–10.5)

## 2016-07-18 LAB — VITAMIN B12: Vitamin B-12: 382 pg/mL (ref 180–914)

## 2016-07-18 LAB — BASIC METABOLIC PANEL
ANION GAP: 8 (ref 5–15)
BUN: 17 mg/dL (ref 6–20)
CHLORIDE: 95 mmol/L — AB (ref 101–111)
CO2: 18 mmol/L — ABNORMAL LOW (ref 22–32)
Calcium: 7.4 mg/dL — ABNORMAL LOW (ref 8.9–10.3)
Creatinine, Ser: 1.56 mg/dL — ABNORMAL HIGH (ref 0.61–1.24)
GFR calc Af Amer: 56 mL/min — ABNORMAL LOW (ref >60)
GFR calc non Af Amer: 49 mL/min — ABNORMAL LOW (ref >60)
GLUCOSE: 677 mg/dL — AB (ref 65–99)
POTASSIUM: 3 mmol/L — AB (ref 3.5–5.1)
SODIUM: 121 mmol/L — AB (ref 135–145)

## 2016-07-18 LAB — TSH: TSH: 0.274 u[IU]/mL — ABNORMAL LOW (ref 0.350–4.500)

## 2016-07-18 LAB — LIPID PANEL
CHOLESTEROL: 162 mg/dL (ref 0–200)
HDL: 17 mg/dL — ABNORMAL LOW (ref >40)
LDL CALC: UNDETERMINED mg/dL (ref 0–99)
Total CHOL/HDL Ratio: 9.5 RATIO
Triglycerides: 650 mg/dL — ABNORMAL HIGH (ref <150)
VLDL: UNDETERMINED mg/dL (ref 0–40)

## 2016-07-18 LAB — GLUCOSE, CAPILLARY
GLUCOSE-CAPILLARY: 211 mg/dL — AB (ref 65–99)
GLUCOSE-CAPILLARY: 175 mg/dL — AB (ref 65–99)
GLUCOSE-CAPILLARY: 121 mg/dL — AB (ref 65–99)
GLUCOSE-CAPILLARY: 107 mg/dL — AB (ref 65–99)
GLUCOSE-CAPILLARY: 175 mg/dL — AB (ref 65–99)
GLUCOSE-CAPILLARY: 199 mg/dL — AB (ref 65–99)
GLUCOSE-CAPILLARY: 227 mg/dL — AB (ref 65–99)
Glucose-Capillary: 200 mg/dL — ABNORMAL HIGH (ref 65–99)
Glucose-Capillary: 191 mg/dL — ABNORMAL HIGH (ref 65–99)
Glucose-Capillary: 174 mg/dL — ABNORMAL HIGH (ref 65–99)
Glucose-Capillary: 127 mg/dL — ABNORMAL HIGH (ref 65–99)
Glucose-Capillary: 101 mg/dL — ABNORMAL HIGH (ref 65–99)
Glucose-Capillary: 237 mg/dL — ABNORMAL HIGH (ref 65–99)

## 2016-07-18 LAB — URINE CULTURE: Culture: NO GROWTH

## 2016-07-18 LAB — PROCALCITONIN: Procalcitonin: 0.13 ng/mL

## 2016-07-18 LAB — RPR: RPR: NONREACTIVE

## 2016-07-18 LAB — HIV ANTIBODY (ROUTINE TESTING W REFLEX): HIV Screen 4th Generation wRfx: NONREACTIVE

## 2016-07-18 MED ORDER — INSULIN GLARGINE 100 UNIT/ML ~~LOC~~ SOLN
40.0000 [IU] | Freq: Every day | SUBCUTANEOUS | Status: DC
  Administered 2016-07-18: 40 [IU] via SUBCUTANEOUS
  Filled 2016-07-18: qty 0.4

## 2016-07-18 MED ORDER — POTASSIUM CHLORIDE CRYS ER 20 MEQ PO TBCR
40.0000 meq | EXTENDED_RELEASE_TABLET | ORAL | Status: DC
  Administered 2016-07-18: 40 meq via ORAL
  Filled 2016-07-18: qty 2

## 2016-07-18 MED ORDER — ASPIRIN EC 325 MG PO TBEC
325.0000 mg | DELAYED_RELEASE_TABLET | Freq: Every day | ORAL | Status: DC
  Administered 2016-07-18: 325 mg via ORAL
  Filled 2016-07-18: qty 1

## 2016-07-18 MED ORDER — FENOFIBRATE 160 MG PO TABS
160.0000 mg | ORAL_TABLET | Freq: Every day | ORAL | Status: DC
  Administered 2016-07-18: 160 mg via ORAL
  Filled 2016-07-18: qty 1

## 2016-07-18 MED ORDER — INSULIN ASPART 100 UNIT/ML ~~LOC~~ SOLN: 0.0000 [IU] | SUBCUTANEOUS | Status: DC

## 2016-07-18 MED ORDER — CLOPIDOGREL BISULFATE 75 MG PO TABS
75.0000 mg | ORAL_TABLET | Freq: Every day | ORAL | Status: DC
  Administered 2016-07-18: 75 mg via ORAL
  Filled 2016-07-18: qty 1

## 2016-07-18 NOTE — Progress Notes (Signed)
PULMONARY / CRITICAL CARE MEDICINE   Name: Alan Bryant MRN: [id number] DOB: 25-Nov-1961    ADMISSION DATE:  07/17/2016 CONSULTATION DATE:  8/15  REFERRING MD:  Dr. Amada Jupiter  CHIEF COMPLAINT:  CVA  HISTORY OF PRESENT ILLNESS:   55 year old male with PMH as below, which is significant for CAD, CVA, DM, and HTN. 8/15 he awoke feeling unwell. He attempted to work, but remained off balance and developed slurred speech and facial droop. He presented to the ER at [facility] with concern for CVA. CT scan without acute finding so he was administered tPA. He was transferred to [facility] for further evaluation. En route he remained hypotensive and received to total of 4 L IVF without significant improvement. He was admitted to the neuro ICU under stroke service. Laboratory evaluation notable for Na 126, CO2 19, Lactic acid 2.4, troponin 0.06, Serum creatinine 2.66, and glucose 584. He was started on insulin infusion per protocol. PCCM consulted for continued hypotension.  SUBJECTIVE: denies CP, dyspnea Good UO Wants to go home    VITAL SIGNS: BP 126/85   Pulse 88   Temp 98.2 F (36.8 C) (Oral)   Resp (!) 25   Ht 5\' 10"  (1.778 m)   Wt 238 lb 1.6 oz (108 kg)   SpO2 98%   BMI 34.16 kg/m   HEMODYNAMICS:    VENTILATOR SETTINGS:    INTAKE / OUTPUT: I/O last 3 completed shifts: In: 668 [P.O.:50; I.V.:618] Out: 1850 [Urine:1850]  PHYSICAL EXAMINATION: General:  Overweight male in NAD Neuro:  Alert, oriented,  Speech nml, no facial droop HEENT:  [place]/AT, PERRL Cardiovascular:  RRR, no MRG Lungs:  Clear bilateral Abdomen:  Soft, non-tender, non-distended Musculoskeletal:  No acute deformity, power 5/5 all 4 Es Skin:  Grossly intact  LABS:  BMET  Recent Labs Lab 07/17/16 1032 07/18/16 0711  NA 126* 121*  K 4.0 3.0*  CL 91* 95*  CO2 19* 18*  BUN 31* 17  CREATININE 2.66* 1.56*  GLUCOSE 584* 677*    Electrolytes  Recent Labs Lab 07/17/16 1032 07/18/16 0711  CALCIUM 9.3  7.4*    CBC  Recent Labs Lab 07/17/16 1032 07/18/16 0711  WBC 11.5* 8.9  HGB 15.9 12.3*  HCT 46.8 37.7*  PLT 278 239    Coag's  Recent Labs Lab 07/17/16 1032  APTT 29  INR 0.93    Sepsis Markers  Recent Labs Lab 07/17/16 1505 07/17/16 1506 07/17/16 1722 07/18/16 0319  LATICACIDVEN 2.4*  --  2.0*  --   PROCALCITON  --  0.23  --  0.13    ABG No results for input(s): PHART, PCO2ART, PO2ART in the last 168 hours.  Liver Enzymes  Recent Labs Lab 07/17/16 1032  AST 37  ALT 41  ALKPHOS 108  BILITOT 0.7  ALBUMIN 3.8    Cardiac Enzymes  Recent Labs Lab 07/17/16 1032  TROPONINI 0.06*    Glucose  Recent Labs Lab 07/18/16 0451 07/18/16 0554 07/18/16 0650 07/18/16 0808 07/18/16 0919 07/18/16 1007  GLUCAP 174* 127* 101* 121* 107* 175*    Imaging Ct Angio Head W Or Wo Contrast  Result Date: 07/17/2016 CLINICAL DATA:  Emergency presentation with slurred speech and left facial droop. EXAM: CT ANGIOGRAPHY HEAD AND NECK TECHNIQUE: Multidetector CT imaging of the head and neck was performed using the standard protocol during bolus administration of intravenous contrast. Multiplanar CT image reconstructions and MIPs were obtained to evaluate the vascular anatomy. Carotid stenosis measurements (when applicable) are obtained  utilizing NASCET criteria, using the distal internal carotid diameter as the denominator. CONTRAST:  75 cc Isovue 370 COMPARISON:  Head CT earlier today. FINDINGS: CT HEAD The brain does not show advanced generalized atrophy. There are old small vessel cerebellar infarctions on the right. Cerebral hemispheres show chronic small-vessel ischemic changes of the white matter. No identifiable acute infarction. No hemorrhage, mass effect, hydrocephalus or extra-axial collection. There is atherosclerotic calcification of the major vessels at the base of the brain. CTA NECK Aortic arch: Aortic atherosclerosis. No brachiocephalic vessel origin stenosis.  Common origin of the innominate artery and left common carotid artery. Right carotid system: Diffuse atherosclerotic disease of the common carotid artery with narrowing throughout the course of 30-40%. Atherosclerotic disease at the carotid bifurcation and ICA bulb. Minimal diameter in the ICA bulb measures 1.3 mm. Compared to a more distal cervical ICA diameter of 4 mm, this indicates a 70% stenosis. Left carotid system: Common carotid artery shows diffuse atherosclerotic disease with narrowing throughout its course estimated at 30%. Atherosclerotic disease at the carotid bifurcation and ICA bulb. Minimal ICA bulb diameter is 2 mm. Compared to a more distal cervical ICA diameter of 5 mm, this indicates a 60% stenosis. Vertebral arteries:Vertebral artery origins cannot be evaluated because of shoulder density and venous reflux. In the midcervical region, there is flow in both vertebral arteries, though the vessels show diffuse atherosclerotic narrowing and irregularity. Skeleton: Negative Other neck: No mass or lymphadenopathy. Upper chest: Lung apices are clear. CTA HEAD Anterior circulation: Severe atherosclerotic disease affecting the internal carotid arteries as they pass through the skullbase, this is particularly pronounced on the left with narrowing and irregularity in the carotid canal. Both carotid siphons show advanced atherosclerotic disease with calcification and narrowing. Stenoses in these regions are probably 70% or greater. The anterior and middle cerebral vessels are patent but show narrowing and irregularity bilaterally. A1 segment on the right is quite diminutive. No evidence of medium or large vessel intracranial occlusion. Distal vessel irregularity is present. Posterior circulation: Both vertebral arteries are patent through the foramen magnum. There is stenosis and irregularity of the distal vertebral arteries bilaterally. The proximal basilar artery is markedly stenotic. Vessel limb an in  that region is 1 mm or less. Basilar artery shows diffuse atherosclerotic narrowing and irregularity. Flow is present in the superior cerebellar and posterior cerebral arteries bilaterally. PCAs receive most of there supply from the anterior circulation. Distal vessel irregularity is present. Venous sinuses: Patent and normal Anatomic variants: None significant Delayed phase: No abnormal enhancement IMPRESSION: Severe diffuse atherosclerotic disease. Vessels in the neck show circumferential intimal thickening. Atherosclerotic disease affecting the internal carotid arteries bilaterally. Stenosis on the right estimated at 70%. Stenosis on the left estimated at 60%. Severe atherosclerotic narrowing of the internal carotid arteries as they pass through the skullbase in the siphon regions. There are probable serial 70% stenoses on both sides. Patient is certainly be at risk of internal carotid artery occlusion on either or both sides. Severe stenosis affecting the distal vertebral arteries and the proximal basilar artery. The patient would be at risk of posterior circulation occlusion. Distal vessel atherosclerotic irregularity diffusely consistent with intracranial medium to small-vessel atherosclerotic disease. Electronically Signed   By: Paulina FusiMark  Shogry M.D.   On: 07/17/2016 11:59   Dg Chest 2 View  Result Date: 07/17/2016 CLINICAL DATA:  Stroke. EXAM: CHEST  2 VIEW COMPARISON:  05/04/2013 FINDINGS: Low volume film with stable asymmetric elevation right hemidiaphragm. There is right base atelectasis. No edema  or focal airspace consolidation. Cardiopericardial silhouette is at upper limits of normal for size. The visualized bony structures of the thorax are intact. Telemetry leads overlie the chest. IMPRESSION: No active cardiopulmonary disease. Electronically Signed   By: Kennith Center M.D.   On: 07/17/2016 16:49   Ct Angio Neck W Or Wo Contrast  Result Date: 07/17/2016 CLINICAL DATA:  Emergency presentation with  slurred speech and left facial droop. EXAM: CT ANGIOGRAPHY HEAD AND NECK TECHNIQUE: Multidetector CT imaging of the head and neck was performed using the standard protocol during bolus administration of intravenous contrast. Multiplanar CT image reconstructions and MIPs were obtained to evaluate the vascular anatomy. Carotid stenosis measurements (when applicable) are obtained utilizing NASCET criteria, using the distal internal carotid diameter as the denominator. CONTRAST:  75 cc Isovue 370 COMPARISON:  Head CT earlier today. FINDINGS: CT HEAD The brain does not show advanced generalized atrophy. There are old small vessel cerebellar infarctions on the right. Cerebral hemispheres show chronic small-vessel ischemic changes of the white matter. No identifiable acute infarction. No hemorrhage, mass effect, hydrocephalus or extra-axial collection. There is atherosclerotic calcification of the major vessels at the base of the brain. CTA NECK Aortic arch: Aortic atherosclerosis. No brachiocephalic vessel origin stenosis. Common origin of the innominate artery and left common carotid artery. Right carotid system: Diffuse atherosclerotic disease of the common carotid artery with narrowing throughout the course of 30-40%. Atherosclerotic disease at the carotid bifurcation and ICA bulb. Minimal diameter in the ICA bulb measures 1.3 mm. Compared to a more distal cervical ICA diameter of 4 mm, this indicates a 70% stenosis. Left carotid system: Common carotid artery shows diffuse atherosclerotic disease with narrowing throughout its course estimated at 30%. Atherosclerotic disease at the carotid bifurcation and ICA bulb. Minimal ICA bulb diameter is 2 mm. Compared to a more distal cervical ICA diameter of 5 mm, this indicates a 60% stenosis. Vertebral arteries:Vertebral artery origins cannot be evaluated because of shoulder density and venous reflux. In the midcervical region, there is flow in both vertebral arteries, though  the vessels show diffuse atherosclerotic narrowing and irregularity. Skeleton: Negative Other neck: No mass or lymphadenopathy. Upper chest: Lung apices are clear. CTA HEAD Anterior circulation: Severe atherosclerotic disease affecting the internal carotid arteries as they pass through the skullbase, this is particularly pronounced on the left with narrowing and irregularity in the carotid canal. Both carotid siphons show advanced atherosclerotic disease with calcification and narrowing. Stenoses in these regions are probably 70% or greater. The anterior and middle cerebral vessels are patent but show narrowing and irregularity bilaterally. A1 segment on the right is quite diminutive. No evidence of medium or large vessel intracranial occlusion. Distal vessel irregularity is present. Posterior circulation: Both vertebral arteries are patent through the foramen magnum. There is stenosis and irregularity of the distal vertebral arteries bilaterally. The proximal basilar artery is markedly stenotic. Vessel limb an in that region is 1 mm or less. Basilar artery shows diffuse atherosclerotic narrowing and irregularity. Flow is present in the superior cerebellar and posterior cerebral arteries bilaterally. PCAs receive most of there supply from the anterior circulation. Distal vessel irregularity is present. Venous sinuses: Patent and normal Anatomic variants: None significant Delayed phase: No abnormal enhancement IMPRESSION: Severe diffuse atherosclerotic disease. Vessels in the neck show circumferential intimal thickening. Atherosclerotic disease affecting the internal carotid arteries bilaterally. Stenosis on the right estimated at 70%. Stenosis on the left estimated at 60%. Severe atherosclerotic narrowing of the internal carotid arteries as they pass through  the skullbase in the siphon regions. There are probable serial 70% stenoses on both sides. Patient is certainly be at risk of internal carotid artery occlusion  on either or both sides. Severe stenosis affecting the distal vertebral arteries and the proximal basilar artery. The patient would be at risk of posterior circulation occlusion. Distal vessel atherosclerotic irregularity diffusely consistent with intracranial medium to small-vessel atherosclerotic disease. Electronically Signed   By: Paulina FusiMark  Shogry M.D.   On: 07/17/2016 11:59     STUDIES:  CT head 8/15 >No acute finding. Advanced for age chronic microvascular disease. Chronic changes could obscure a new white matter infarct. CTA head 8/15 > Severe diffuse atherosclerotic disease including vessels of neck, vertebral, and basilar artery.  CULTURES:   ANTIBIOTICS:   SIGNIFICANT EVENTS: 8/15 > CVA, Admit  LINES/TUBES:  I reviewed head CT   ASSESSMENT / PLAN:   CARDIOVASCULAR A:  Hypotension - likely due to nitro patch, seems that he was adequately fluid resuscitated.  Target MAP of 75. P:  - NS 100 ml/hr. - Removed nitropatch. - Off neo gtt - HOld lisinopril  RENAL A:   Metabolic acidosis - AG - not DKA since BHB nml AKI P:   - IVF resuscitation. - Replace electrolytes as indicated.     ENDOCRINE A:   DM  - not DKA P:    -transition off Insulin drip with lantus 40 -SSI.  NEUROLOGIC A:   Doubt CVA UDS pos cocaine P:   - S/P tPA. - per neurology. - Subs abuse counselling  He wants to go home today. I convinced him to stay 24h Can transfer to floor once off insulin gtt - repeat na  PCCM available as needed  Cyril Mourningakesh Cathie Bonnell MD. FCCP. [facility] Pager [phone] If no response call [phone]    07/18/2016, 11:14 AM

## 2016-07-18 NOTE — Progress Notes (Signed)
Paged Dr Roxy Mannsster about the pts continually low BP, SBP got as low as 60. Orders received for Neo gtt to keep MAP >70

## 2016-07-18 NOTE — Progress Notes (Signed)
PT Cancellation Note  Patient Details Name: Alan Bryant MRN: [id number] DOB: 04/11/1961   Cancelled Treatment:    Reason Eval/Treat Not Completed: Patient not medically ready (active bedrest orders)   [name], [name] 07/18/2016, 7:14 AM [name], PT DPT  [phone]

## 2016-07-18 NOTE — Evaluation (Signed)
Speech Language Pathology Evaluation Patient Details Name: Alan Bryant MRN: [id number] DOB: 06/07/1961 Today's Date: 07/18/2016 Time: [id number] SLP Time Calculation (min) (ACUTE ONLY): 25 min  Problem List:  Patient Active Problem List   Diagnosis Date Noted  . CVA (cerebral infarction) 07/17/2016   Past Medical History:  Past Medical History:  Diagnosis Date  . Blockage of coronary artery of heart (HCC)   . CVA (cerebral infarction)   . Diabetes mellitus without complication (HCC)   . Hypertension   . Neuropathy (HCC)   . PTSD (post-traumatic stress disorder)    Past Surgical History:  Past Surgical History:  Procedure Laterality Date  . SHOULDER SURGERY     HPI:  55 y.o.malemalewith a history of CAD, DM, PTSD, poorly compliant with medications admitted with slurred speech and facial droop. CT no acute finding, advanced for age chronic microvascular disease. Chronic changes could obscure a new white matter infarct. Received TPA   Assessment / Plan / Recommendation Clinical Impression  Pt scored a 24/30 on MOCA demonstrating mild impairments. Decreased safety, intellectual and anicipatory awareness. Recalled 2/5 words on word recall subtest and RN states moderate assist recalling general information. Mild dysarthria marked by phonemic distortions with increased rate of speech. Pt states he is "fine, speech was like this before. I can walk fine and I'm not trying to be difficult but I just don't care and want to get out of here and can do outpatient." He also stated "my plate is full with things at home." ST will see pt for dysarthria and cognitive intervention.     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)   Cognition  Overall Cognitive Status: Difficult to assess Arousal/Alertness: Awake/alert Orientation Level: Oriented X4 Attention: Sustained Sustained Attention: Appears intact Memory: Impaired Memory Impairment: Storage deficit Awareness: Impaired Awareness Impairment: Intellectual impairment;Emergent impairment;Anticipatory impairment Problem Solving: Appears intact Behaviors: Poor frustration tolerance Safety/Judgment: Impaired    Comprehension  Auditory Comprehension Overall Auditory Comprehension: Appears within functional limits for tasks assessed Visual Recognition/Discrimination Discrimination: Not tested Reading Comprehension Reading Status: Not tested    Expression Expression Primary Mode of Expression: Verbal Verbal Expression Overall Verbal Expression: Appears within functional limits for tasks assessed Written Expression Dominant Hand: Right Written Expression: Not tested   Oral / Motor  Oral Motor/Sensory Function Overall Oral Motor/Sensory Function: Within functional limits Motor Speech Overall Motor Speech: Impaired Respiration: Within functional limits Phonation: Normal Resonance: Within functional limits Articulation: Within functional limitis Intelligibility: Intelligibility reduced Word: 75-100% accurate Phrase: 75-100% accurate Sentence: 75-100% accurate Conversation: 75-100% accurate Motor Planning: Witnin functional limits   GO                    Alan Bryant, Alan Bryant 07/18/2016, 9:00 AM  Alan Bryant Alan Bryant Pager [phone]

## 2016-07-18 NOTE — Progress Notes (Signed)
OT Cancellation Note  Patient Details Name: Alan PottersLeo J Kraai MRN: [id number] DOB: 12/05/1960   Cancelled Treatment:    Reason Eval/Treat Not Completed: Patient not medically ready (bedrest)OT order received and appreciated however this conflicts with current bedrest order set. Please increase activity tolerance as appropriate and remove bedrest from orders. . Please contact OT at [phone] if bed rest order is discontinued. OT will hold evaluation at this time and will check back as time allows pending increased activity orders.   Boone MasterJones, Lashawndra Lampkins B 07/18/2016, 7:10 AM

## 2016-07-18 NOTE — Progress Notes (Signed)
STROKE TEAM PROGRESS NOTE   SUBJECTIVE (INTERVAL HISTORY) Hypotensive yesterday requiring Neo gtt. No family at bedside. He is sitting up in bed eating breakfast. He wants to go home. He feels that he has returned to baseline. He does not want to have MRI done unless he can have conscious sedation - reports he has claustrophobia.  OBJECTIVE Temp:  [97.5 F (36.4 C)-98.4 F (36.9 C)] 97.8 F (36.6 C) (08/16 0400) Pulse Rate:  [25-97] 73 (08/16 0700) Cardiac Rhythm: Normal sinus rhythm (08/15 2000) Resp:  [16-27] 22 (08/16 0700) BP: (65-148)/(24-103) 116/84 (08/16 0700) SpO2:  [91 %-100 %] 96 % (08/16 0700) Weight:  [106.5 kg (234 lb 14.4 oz)-108 kg (238 lb 1.6 oz)] 108 kg (238 lb 1.6 oz) (08/15 1300)  CBC:  Recent Labs Lab 07/17/16 1032  WBC 11.5*  NEUTROABS 7.3*  HGB 15.9  HCT 46.8  MCV 84.6  PLT 278    Basic Metabolic Panel:  Recent Labs Lab 07/17/16 1032  NA 126*  K 4.0  CL 91*  CO2 19*  GLUCOSE 584*  BUN 31*  CREATININE 2.66*  CALCIUM 9.3    Lipid Panel:    Component Value Date/Time   CHOL 162 07/18/2016 0319   TRIG 650 (H) 07/18/2016 0319   HDL 17 (L) 07/18/2016 0319   CHOLHDL 9.5 07/18/2016 0319   VLDL UNABLE TO CALCULATE IF TRIGLYCERIDE OVER 400 mg/dL 40/98/119108/16/2017 47820319   LDLCALC UNABLE TO CALCULATE IF TRIGLYCERIDE OVER 400 mg/dL 95/62/130808/16/2017 65780319   IONG2XHgbA1c: No results found for: HGBA1C Urine Drug Screen:    Component Value Date/Time   LABOPIA NONE DETECTED 07/17/2016 1716   COCAINSCRNUR POSITIVE (A) 07/17/2016 1716   LABBENZ NONE DETECTED 07/17/2016 1716   AMPHETMU NONE DETECTED 07/17/2016 1716   THCU NONE DETECTED 07/17/2016 1716   LABBARB NONE DETECTED 07/17/2016 1716     IMAGING  Dg Chest 2 View 07/17/2016 No active cardiopulmonary disease.   Ct Head Code Stroke W/o Cm 07/17/2016  1. No acute finding. ASPECTS is 10. 2. Advanced for age chronic microvascular disease. Chronic changes could obscure a new white matter infarct.   Ct Angio Head W  Or Wo Contrast Ct Angio Neck W Or Wo Contrast 07/17/2016  Severe diffuse atherosclerotic disease. Vessels in the neck show circumferential intimal thickening. Atherosclerotic disease affecting the internal carotid arteries bilaterally. Stenosis on the right estimated at 70%. Stenosis on the left estimated at 60%. Severe atherosclerotic narrowing of the internal carotid arteries as they pass through the skullbase in the siphon regions. There are probable serial 70% stenoses on both sides. Patient is certainly be at risk of internal carotid artery occlusion on either or both sides. Severe stenosis affecting the distal vertebral arteries and the proximal basilar artery. The patient would be at risk of posterior circulation occlusion. Distal vessel atherosclerotic irregularity diffusely consistent with intracranial medium to small-vessel atherosclerotic disease.   Ct Head Wo Contrast 07/18/2016 Atrophy and chronic ischemic changes are stable. No acute infarct or hemorrhage.   PHYSICAL EXAM General - Well nourished, well developed, in no apparent distress.  Cardiovascular - Regular rate and rhythm.  Mental Status -  Level of arousal and orientation to time, place, and person were intact. Language including expression, naming, repetition, comprehension was assessed and found intact.  Cranial Nerves II - XII - II - Visual field intact OU. III, IV, VI - Extraocular movements intact. V - Facial sensation intact bilaterally. VII - Facial movement intact bilaterally. VIII - Hearing & vestibular intact bilaterally.  X - Palate elevates symmetrically. XI - Chin turning & shoulder shrug intact bilaterally. XII - Tongue protrusion intact.  Motor Strength - The patient's strength was normal in all extremities and pronator drift was absent.  Bulk was normal and fasciculations were absent.   Motor Tone - Muscle tone was assessed at the neck and appendages and was normal.  Reflexes - The patient's  reflexes were 1+ in all extremities and he had no pathological reflexes.  Sensory - Light touch, temperature/pinprick were assessed and were symmetrical.    Coordination - The patient had normal movements in the hands with no ataxia or dysmetria.  Tremor was absent.  Gait and Station - deferred.  ASSESSMENT/PLAN [name] is a 55 y.o. male with history of CAD, DM and medication non-compliance presenting with slurred speech, facial droop and dizziness. [facility]   Stroke:  Cerebral infarct s/p IV tPA secondary to severe diffuse atherosclerotic disease including vessels of neck, vertebral and basilar artery  Resultant  Deficit resolved  CTA head and neck diffuse atherosclerotic disease. R ICA 70%. L ICA 60%. Serial 70% stenoses on both sides.   MRI  Patient refused d/t PTSD.   CT 24h post tPA with no acute infarct or hemorrhage.  2D Echo  pending  LDL unable to calculate  HgbA1c pending  SCDs for VTE prophylaxis  Diet Carb Modified Fluid consistency: Thin; Room service appropriate? Yes  No antithrombotic prior to admission, now on No antithrombotic as within 24h of tPA. Start aspirin 325mg  daily.  Ongoing aggressive stroke risk factor management  Therapy recommendations:  pending   Disposition:  pending   Hypotension Hx Hypertension  As low as 65/24  Fluid resuscitated in transport  Felt to be d/t NTG patch  Last BP 130-92 this am  Long-term BP goal normotensive  Hyperlipidemia  Home meds:  lipitor 80, resumed in hospital  LDL unable to calculate, goal < 70  Continue statin at discharge  Diabetes  HgbA1c pending, goal < 7.0  Uncontrolled  Started on insulin drip  Lab draw 677, however finger sticks pre and post lab draw in the 120s.  Other Stroke Risk Factors  Cigarette smoker, advised to stop smoking  Cocaine abuse, UDS positive on admission  Marijuana use, reported, UDS neg this  admission  ETOH use, advised to drink no more than 2 drink(s) a day  Obesity, Body mass index is 34.16 kg/m., recommend weight loss, diet and exercise as appropriate   Hx stroke  Multiple prior strokes per pt  Coronary artery disease   Other Active Problems  Metabolic acidosis  Chronic renal insufficiency 2.66->1.56  PseudoHyponatremia 121. Corrected for glucose of 677 is 135.  Hypokalemia 3.0, replete prn  Lactic acid 2.0  Acute Normocytic Anemia Hgb 15.9-> 12.3. Baseline hgb around 13-14. Continue to monitor.  Hospital day # 1  Discussed with Dr. Katy FitchXu  Bobbi Kozakiewicz, MD  Internal Medicine PGY-3 07/18/16 2:21 PM     To contact Stroke Continuity provider, please refer to [email or web address]. After hours, contact General Neurology

## 2016-07-18 NOTE — Plan of Care (Signed)
Problem: Health Behavior/Discharge Planning: Goal: Ability to manage health-related needs will improve Outcome: Progressing Discussed the importance of making and keeping future MD appointments and to take medications as indicated for managing diabetes

## 2016-07-18 NOTE — Progress Notes (Signed)
PT Cancellation Note  Patient Details Name: Alan PottersLeo J Riemann MRN: [id number] DOB: 05/19/1961   Cancelled Treatment:     Patient leaving AMA per nsg.   [name], Ilaria Much J 07/18/2016, 3:37 PM Charlotte Crumbevon Cola [place], PT DPT  [phone]

## 2016-07-18 NOTE — Plan of Care (Signed)
Called by nurse that pt would like to sign for AMA.  Came over and had long discussion with pt trying to persuade him staying for stroke work up completion and control risk factors. He declined and chose to sign AMA.   [name], MD PhD Stroke Neurology 07/18/2016 3:27 PM

## 2016-07-19 LAB — HEMOGLOBIN A1C
Hgb A1c MFr Bld: 15 % — ABNORMAL HIGH (ref 4.8–5.6)
MEAN PLASMA GLUCOSE: 384 mg/dL

## 2016-07-19 NOTE — Discharge Summary (Signed)
Stroke Discharge Summary  Patient ID: Alan Bryant   MRN: [id number]      DOB: [birth year]  Date of Admission: 07/17/2016 Date of Discharge: 07/19/2016  Attending Physician:  No att. providers found, Stroke MD Consulting Physician(s):  Critical care medicine Patient's PCP:  No PCP Per Patient  DISCHARGE DIAGNOSIS:  Active Problems:   CVA (cerebral infarction)   Intracranial vascular stenosis   HTN   HLD   Uncontrolled DM   Smoker    Cocaine abuse   Medication noncompliance     BMI: Body mass index is 34.16 kg/m.  Past Medical History:  Diagnosis Date  . Blockage of coronary artery of heart (HCC)   . CVA (cerebral infarction)   . Diabetes mellitus without complication (HCC)   . Hypertension   . Neuropathy (HCC)   . PTSD (post-traumatic stress disorder)    Past Surgical History:  Procedure Laterality Date  . SHOULDER SURGERY        Medication List    ASK your doctor about these medications   atorvastatin 80 MG tablet Commonly known as:  LIPITOR Take 80 mg by mouth daily at 6 PM.   BC HEADACHE 325-95-16 MG Tabs Generic drug:  Aspirin-Salicylamide-Caffeine Take 1 packet by mouth daily as needed (for headache).   gabapentin 300 MG capsule Commonly known as:  NEURONTIN Take 1,200 mg by mouth 2 (two) times daily.   haloperidol 1 MG tablet Commonly known as:  HALDOL Take 1 mg by mouth at bedtime.   hydrOXYzine 25 MG capsule Commonly known as:  VISTARIL Take 25 mg by mouth 2 (two) times daily as needed for anxiety.   insulin aspart 100 UNIT/ML injection Commonly known as:  novoLOG Inject 10-40 Units into the skin 2 (two) times daily. 40 units in the morning and 10 units at bedtime   insulin glargine 100 UNIT/ML injection Commonly known as:  LANTUS Inject 25 Units into the skin at bedtime.   lisinopril 40 MG tablet Commonly known as:  PRINIVIL,ZESTRIL Take 40 mg by mouth 2 (two) times daily.   metoprolol tartrate 25 MG tablet Commonly known as:   LOPRESSOR Take 25 mg by mouth daily.   nitroGLYCERIN 0.2 mg/hr patch Commonly known as:  NITRODUR - Dosed in mg/24 hr Place 0.2 mg onto the skin daily.   zolpidem 10 MG tablet Commonly known as:  AMBIEN Take 10 mg by mouth at bedtime as needed for sleep.       LABORATORY STUDIES CBC    Component Value Date/Time   WBC 8.9 07/18/2016 0711   RBC 4.35 07/18/2016 0711   HGB 12.3 (L) 07/18/2016 0711   HGB 15.0 05/04/2013 0616   HCT 37.7 (L) 07/18/2016 0711   HCT 42.7 05/04/2013 0616   PLT 239 07/18/2016 0711   PLT 255 05/04/2013 0616   MCV 86.7 07/18/2016 0711   MCV 86 05/04/2013 0616   MCH 28.3 07/18/2016 0711   MCHC 32.6 07/18/2016 0711   RDW 12.6 07/18/2016 0711   RDW 12.9 05/04/2013 0616   LYMPHSABS 3.2 07/17/2016 1032   LYMPHSABS 2.0 05/04/2013 0616   MONOABS 0.5 07/17/2016 1032   MONOABS 0.6 05/04/2013 0616   EOSABS 0.3 07/17/2016 1032   EOSABS 0.7 05/04/2013 0616   BASOSABS 0.2 (H) 07/17/2016 1032   BASOSABS 0.1 05/04/2013 0616   CMP    Component Value Date/Time   NA 121 (L) 07/18/2016 0711   NA 137 05/04/2013 0616   K 3.0 (L) 07/18/2016  0711   K 3.5 05/04/2013 0616   CL 95 (L) 07/18/2016 0711   CL 104 05/04/2013 0616   CO2 18 (L) 07/18/2016 0711   CO2 26 05/04/2013 0616   GLUCOSE 677 (HH) 07/18/2016 0711   GLUCOSE 191 (H) 05/04/2013 0616   BUN 17 07/18/2016 0711   BUN 24 (H) 05/04/2013 0616   CREATININE 1.56 (H) 07/18/2016 0711   CREATININE 1.90 (H) 05/04/2013 0616   CALCIUM 7.4 (L) 07/18/2016 0711   CALCIUM 8.6 05/04/2013 0616   PROT 6.9 07/17/2016 1032   PROT 7.1 05/04/2013 0616   ALBUMIN 3.8 07/17/2016 1032   ALBUMIN 3.3 (L) 05/04/2013 0616   AST 37 07/17/2016 1032   AST 19 05/04/2013 0616   ALT 41 07/17/2016 1032   ALT 22 05/04/2013 0616   ALKPHOS 108 07/17/2016 1032   ALKPHOS 117 05/04/2013 0616   BILITOT 0.7 07/17/2016 1032   BILITOT 0.4 05/04/2013 0616   GFRNONAA 49 (L) 07/18/2016 0711   GFRNONAA 40 (L) 05/04/2013 0616   GFRAA 56 (L)  07/18/2016 0711   GFRAA 46 (L) 05/04/2013 0616   COAGS Lab Results  Component Value Date   INR 0.93 07/17/2016   Lipid Panel    Component Value Date/Time   CHOL 162 07/18/2016 0319   TRIG 650 (H) 07/18/2016 0319   HDL 17 (L) 07/18/2016 0319   CHOLHDL 9.5 07/18/2016 0319   VLDL UNABLE TO CALCULATE IF TRIGLYCERIDE OVER 400 mg/dL 81/19/1478 2956   LDLCALC UNABLE TO CALCULATE IF TRIGLYCERIDE OVER 400 mg/dL 21/30/8657 8469   GEXB2W  Lab Results  Component Value Date   HGBA1C 15.0 (H) 07/18/2016   Cardiac Panel (last 3 results)  Recent Labs  07/17/16 1032  TROPONINI 0.06*   Urinalysis    Component Value Date/Time   COLORURINE YELLOW 07/17/2016 1716   APPEARANCEUR CLEAR 07/17/2016 1716   APPEARANCEUR Hazy 05/04/2013 0432   LABSPEC 1.025 07/17/2016 1716   LABSPEC 1.023 05/04/2013 0432   PHURINE 5.0 07/17/2016 1716   GLUCOSEU >1000 (A) 07/17/2016 1716   GLUCOSEU >=500 05/04/2013 0432   HGBUR NEGATIVE 07/17/2016 1716   BILIRUBINUR NEGATIVE 07/17/2016 1716   BILIRUBINUR Negative 05/04/2013 0432   KETONESUR NEGATIVE 07/17/2016 1716   PROTEINUR NEGATIVE 07/17/2016 1716   UROBILINOGEN 0.2 12/01/2009 2207   NITRITE NEGATIVE 07/17/2016 1716   LEUKOCYTESUR NEGATIVE 07/17/2016 1716   LEUKOCYTESUR Negative 05/04/2013 0432   Urine Drug Screen    Component Value Date/Time   LABOPIA NONE DETECTED 07/17/2016 1716   COCAINSCRNUR POSITIVE (A) 07/17/2016 1716   LABBENZ NONE DETECTED 07/17/2016 1716   AMPHETMU NONE DETECTED 07/17/2016 1716   THCU NONE DETECTED 07/17/2016 1716   LABBARB NONE DETECTED 07/17/2016 1716    Alcohol Level    Component Value Date/Time   ETH (H) 12/01/2009 2226    150        LOWEST DETECTABLE LIMIT FOR SERUM ALCOHOL IS 5 mg/dL FOR MEDICAL PURPOSES ONLY     SIGNIFICANT DIAGNOSTIC STUDIES Dg Chest 2 View 07/17/2016 No active cardiopulmonary disease.   Ct Head Code Stroke W/o Cm 07/17/2016  1. No acute finding. ASPECTS is 10. 2. Advanced for age  chronic microvascular disease. Chronic changes could obscure a new white matter infarct.   Ct Angio Head W Or Wo Contrast Ct Angio Neck W Or Wo Contrast 07/17/2016  Severe diffuse atherosclerotic disease. Vessels in the neck show circumferential intimal thickening. Atherosclerotic disease affecting the internal carotid arteries bilaterally. Stenosis on the right estimated at 70%. Stenosis on  the left estimated at 60%. Severe atherosclerotic narrowing of the internal carotid arteries as they pass through the skullbase in the siphon regions. There are probable serial 70% stenoses on both sides. Patient is certainly be at risk of internal carotid artery occlusion on either or both sides. Severe stenosis affecting the distal vertebral arteries and the proximal basilar artery. The patient would be at risk of posterior circulation occlusion. Distal vessel atherosclerotic irregularity diffusely consistent with intracranial medium to small-vessel atherosclerotic disease.   Ct Head Wo Contrast 07/18/2016 Atrophy and chronic ischemic changes are stable. No acute infarct or hemorrhage.     HISTORY OF PRESENT ILLNESS Alan Bryant is an 55 y.o. male malewith a history of CAD, DM, poorly compliant with medications who reports that he awakened today not feeling well but otherwise at baseline. Went to work with his son and on getting out of the truck was off balance and dizzy. They continued to try to work and son noted when he got down off the roof that his father had slurred speech and some facial droop. Patient was brought in to [facility] ER  for evaluation at that time. Initial NIHSS of 7. Patient was given TPA and then transferred to [facility] for further management. On arrival patient was awake and alert and able to state that he was at [facility]. He continued to have a left facial droop, left arm drift, and left leg drift. In route he remained hypotensive and required a total of 4 L of normal saline. On  arrival to [facility] ICU patient's blood pressure was 103/81.  Bladder scan revealed 530 mL of urine in the bladder.    HOSPITAL COURSE [name] is a 55 y.o. male with history of CAD, DM and medication non-compliance presenting with slurred speech, facial droop and dizziness. He received IV t-PA 1048 at [facility] and was transferred to [facility]. Symptoms resolved well. Repeat CT 24h after tPA no hemorrhage. Pt refused MRI. UDS positive for cocaine. CTA head and neck showed multifocal intracranial vascular stenosis. Would like pt finish off stroke work up, however, pt refused and signed AMA.   Stroke:  Cerebral infarct s/p IV tPA secondary to severe diffuse atherosclerotic disease including vessels of neck, vertebral and basilar artery  Resultant  Deficit resolved  CTA head and neck diffuse atherosclerotic disease. R ICA 70%. L ICA 60%. Serial 70% stenoses on both sides.   MRI Patient refused d/t PTSD.   CT 24h post tPA with no acute infarct or hemorrhage.  2D Echo  pending - signed AMA  LDL unable to calculate due to high TG  HgbA1c 15.0  SCDs for VTE prophylaxis  Diet Carb Modified Fluid consistency: Thin; Room service appropriate? Yes  No antithrombotic prior to admission, start aspirin 325mg  daily 24 h after tPA.  Ongoing aggressive stroke risk factor management  Hypotension Hx Hypertension  As low as 65/24  Fluid resuscitated in transport  Felt to be d/t NTG patch  Improved after admission to ICU  Long-term BP goal normotensive  Hyperlipidemia  Home meds:  lipitor 80, resumed in hospital  LDL unable to calculate, goal < 70  Diabetes  HgbA1c 15.0, goal < 7.0  Uncontrolled  Started on insulin drip  Transitioned to lantus.  Tobacco abuse  Current smoker  Smoking cessation counseling provided  Pt is willing to quit  Cocaine abuse  UDS positive for cocaine  Pt is willing to quit  Other Stroke Risk Factors  ETOH use, advised to  drink no more  than 2 drink(s) a day  Obesity, Body mass index is 34.16 kg/m., recommend weight loss, diet and exercise as appropriate   Hx stroke ? Multiple prior strokes per pt  Coronary artery disease   Other Active Problems  Metabolic acidosis  Chronic renal insufficiency 2.66->1.56  PseudoHyponatremia 121. Corrected for glucose of 677 is 135.  Hypokalemia 3.0, replete prn  Lactic acid 2.0  Acute Normocytic Anemia Hgb 15.9-> 12.3. Baseline hgb around 13-14. Continue to monitor.  DISCHARGE EXAM Blood pressure 126/85, pulse 88, temperature 98.2 F (36.8 C), temperature source Oral, resp. rate (!) 25, height 5\' 10"  (1.778 m), weight 238 lb 1.6 oz (108 kg), SpO2 98 %.  HEENT-  Normocephalic, no lesions, without obvious abnormality.  Normal external eye and conjunctiva.  Normal TM's bilaterally.  Normal auditory canals and external ears. Normal external nose, mucus membranes and septum.  Normal pharynx. Cardiovascular- S1, S2 normal, pulses palpable throughout   Lungs- chest clear, no wheezing, rales, normal symmetric air entry Abdomen- Distended with bowel sounds in all 4 quadrants and no discomfort. Extremities- no joint deformities, effusion, or inflammation Lymph-no adenopathy palpable Musculoskeletal-no joint tenderness, deformity or swelling Skin-warm and dry, no hyperpigmentation, vitiligo, or suspicious lesions  Neurological Examination Mental Status: Alert, oriented, thought content appropriate.  Speech dysarthric without evidence of aphasia.  Able to follow 3 step commands without difficulty. Cranial Nerves: II:  Visual fields grossly normal, pupils 3 mm equal, round, reactive to light and accommodation III,IV, VI: ptosis not present, extra-ocular motions intact bilaterally V,VII: smile asymmetric with left facial droop, facial light touch decreased on the left lower face VIII: hearing normal bilaterally IX,X: uvula rises symmetrically XI: bilateral shoulder shrug XII:  midline tongue extension Motor: Right :            Upper extremity   5/5                                                                              Left:     Upper extremity   4+/5                       Lower extremity   5/5                                                                                                    Lower extremity   4+/5 --Drift in both left upper and lower extremity Tone and bulk:normal tone throughout; no atrophy noted Sensory: Decreased in left upper extremity Deep Tendon Reflexes: 1+ and symmetric throughout upper extremities with no ankle jerks or knee jerks. Plantars: Mute bilaterally Cerebellar: Slight left dysmetria with finger-to-nose and normal heel-to-shin test Gait: mild unsteady   Discharge Diet   Low salt diabetic diet, thin  liquid  DISCHARGE PLAN  Disposition:  home  aspirin 325 mg daily and clopidogrel 75 mg daily for secondary stroke prevention - but pt signed AMA  Ongoing risk factor control by Primary Care Physician at time of discharge  Follow-up PCP in 2 weeks.  30 minutes were spent preparing discharge.  Marvel Plan, MD PhD Stroke Neurology 07/19/2016 7:02 PM

## 2016-07-22 LAB — CULTURE, BLOOD (ROUTINE X 2)
CULTURE: NO GROWTH
Culture: NO GROWTH

## 2016-08-24 ENCOUNTER — Other Ambulatory Visit: Payer: Self-pay

## 2016-08-24 NOTE — Patient Outreach (Signed)
[facility]) Care Management  08/24/2016  Alan PottersLeo J Bryant 09/28/1961 [id number]     EMMI-Stroke RED ON EMMI ALERT Day#: 13 Date: 08/22/16 Red Alert Reason: "went to f/u appt? No  Scheduled a f/u appt? No"   Outreach attempt #1 to patient. No answer at present and unable to leave message.       Plan: RN CM will make outreach attempt to patient within one business day.   Antionette Fairyoshanda Dmitriy Gair, RN,BSN,CCM [facility] Care Management Telephonic Care Management Coordinator Direct Phone: [phone] Toll Free: [phone] Fax: [phone]

## 2016-08-27 ENCOUNTER — Other Ambulatory Visit: Payer: Self-pay

## 2016-08-27 NOTE — Patient Outreach (Addendum)
[facility]) Care Management  08/27/2016  Alan Bryant 04/02/1961 [id number]   EMMI-Stroke RED ON EMMI ALERT Day#: 13 Date: 08/22/16 Red Alert Reason: "went to f/u appt? No  Scheduled a f/u appt? No"    Outreach attempt # 2 to patient. A male answered the phone and identified himself as a relative of patient. He reported that he has patient's cell but patient is not with him. Relative reported that he was currently inpatient and using patient's phone while he's in the hospital. He reported that there was no other way to contact or reach patient.    Plan: RN CM will notify [facility] administrative assistant of case closure.  Antionette Fairyoshanda [name], RN,BSN,CCM [facility] Care Management Telephonic Care Management Coordinator Direct Phone: [phone] Toll Free: [phone] Fax: [phone]

## 2016-09-30 IMAGING — DX DG FOOT COMPLETE 3+V*R*
3 series · 3 of 3 positions shown · non-contrast
Comparison: Radiograph dated 04/01/2016

CLINICAL DATA: 54-year-old male with right foot pain

EXAM:
RIGHT FOOT COMPLETE - 3+ VIEW

[x foot lat right]
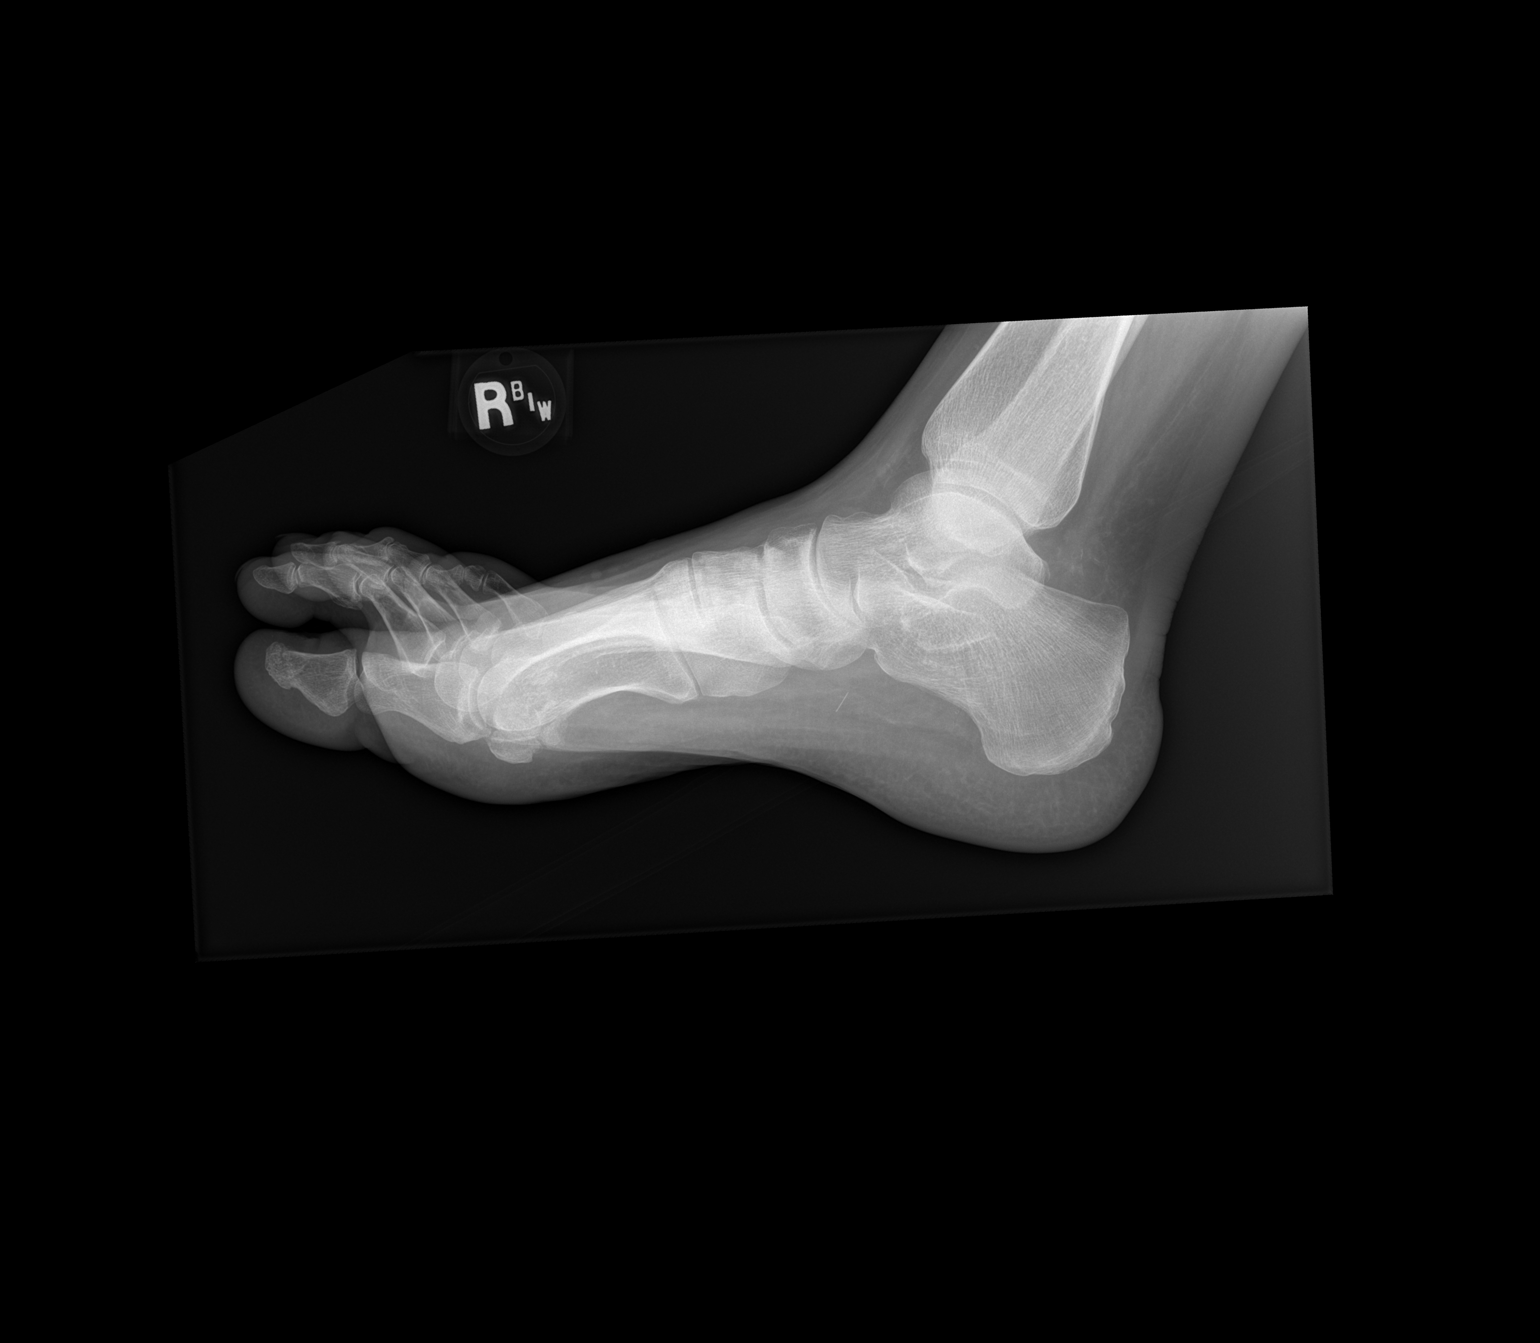

[x foot ap right]
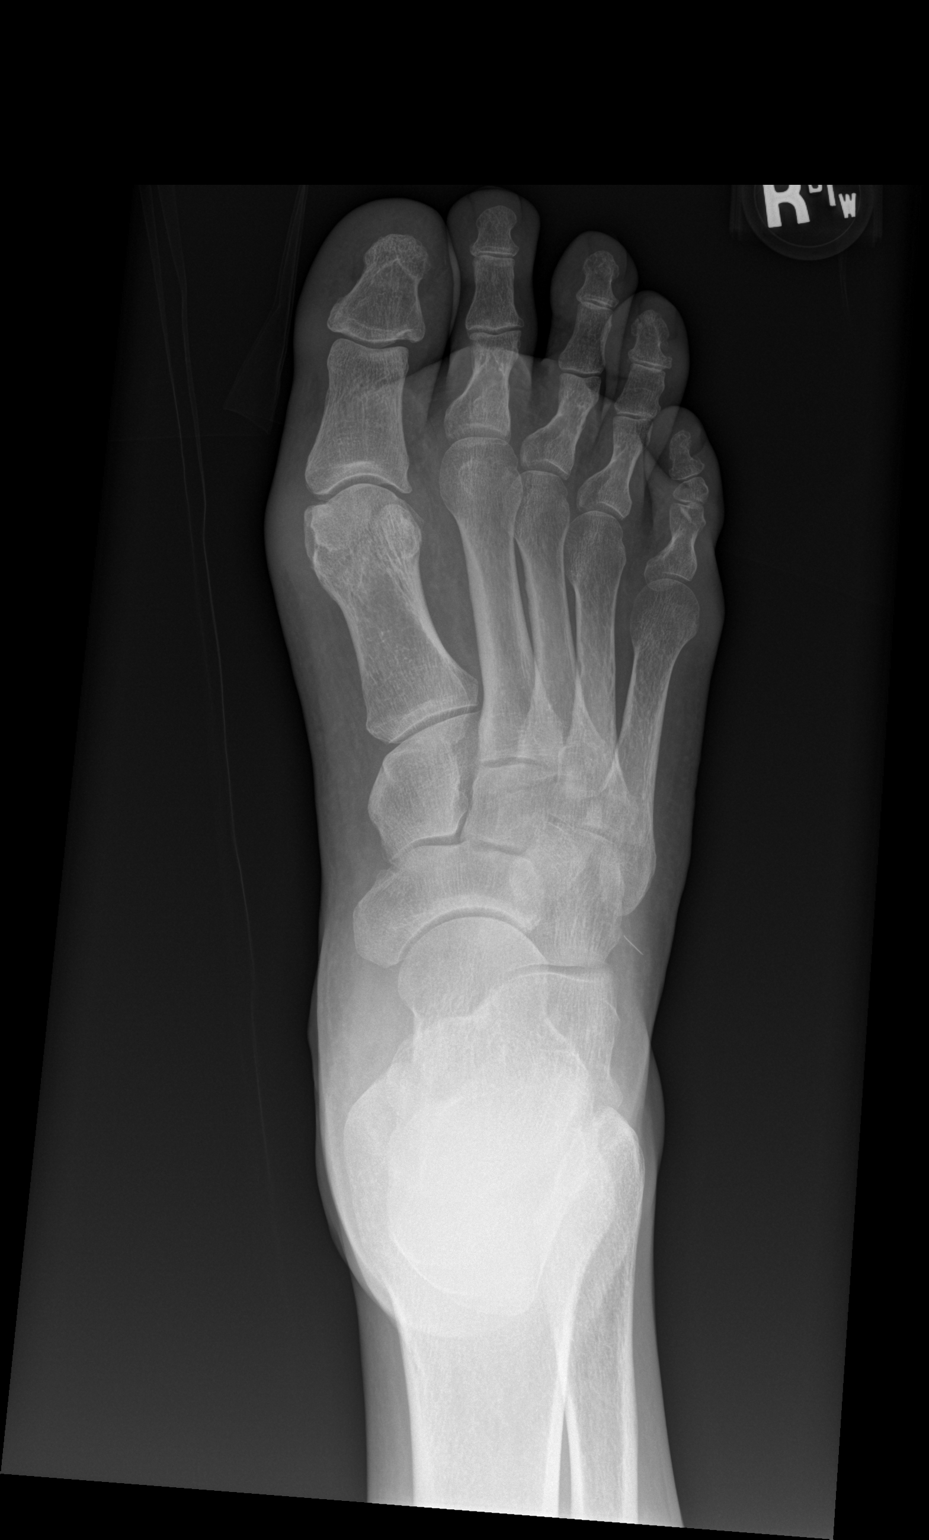

[x foot obl right]
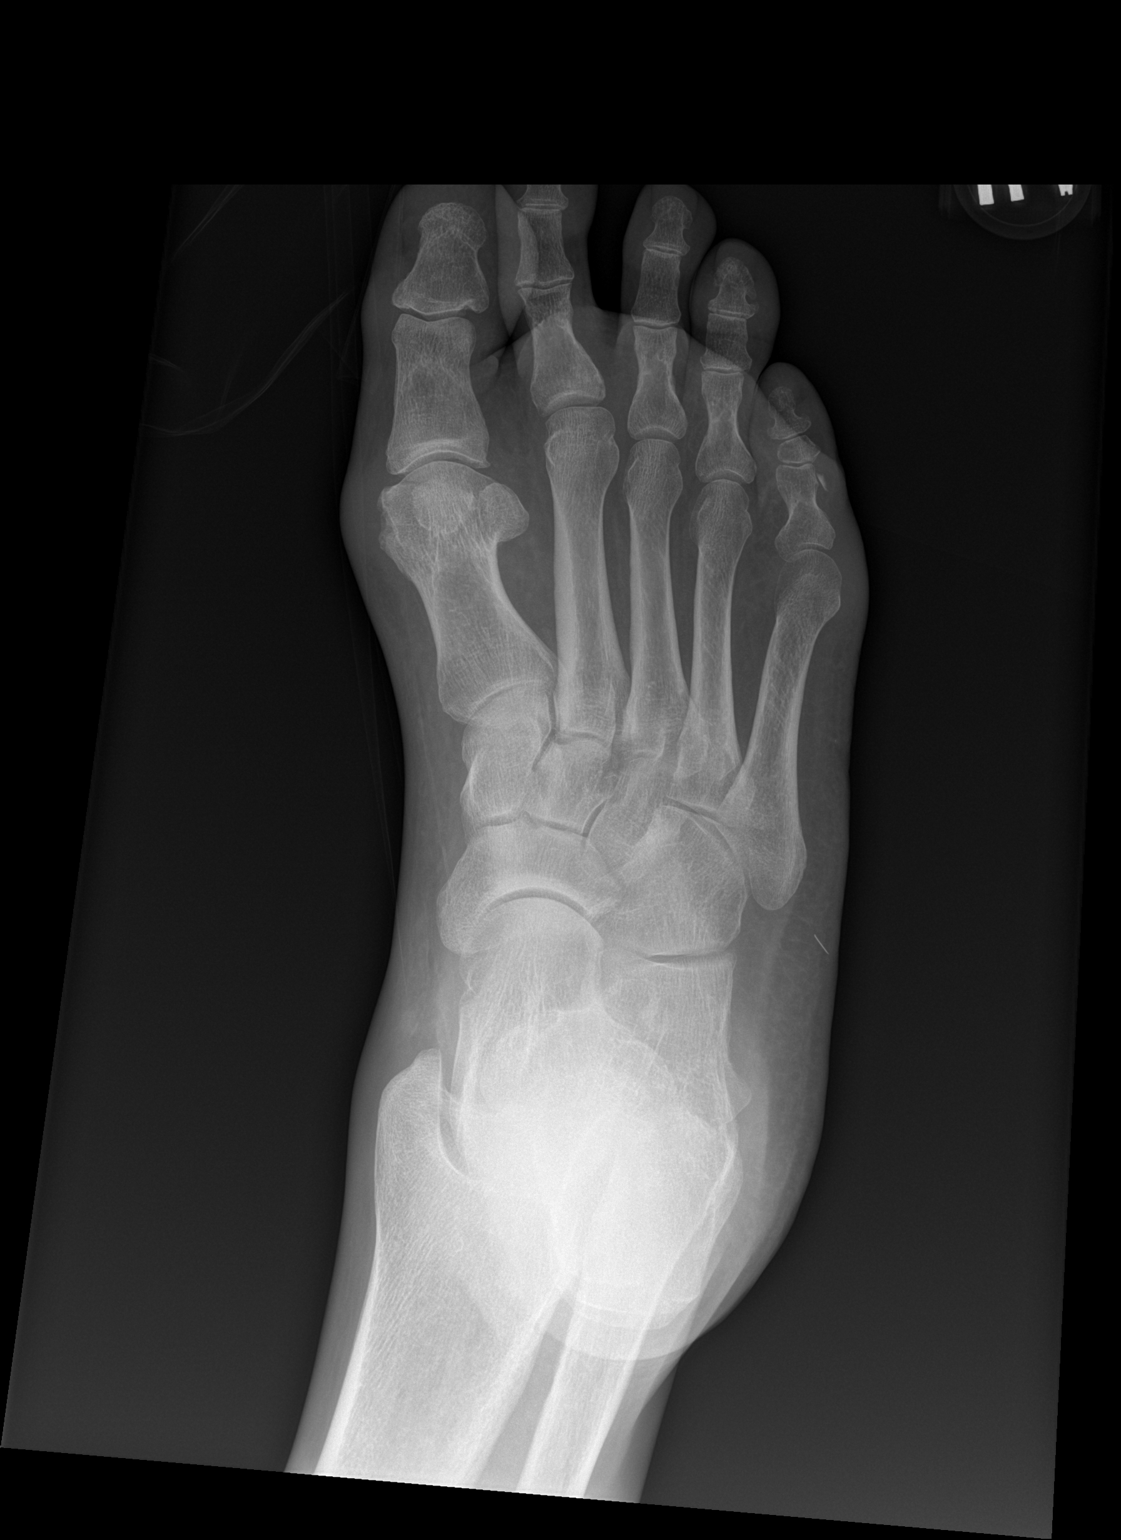

[3 of 3 positions shown; findings below may reference images not displayed]

FINDINGS: There is is a 4 mm linear density along the lateral aspect of the
proximal phalanx of the fifth digit likely representing a foreign
object and corresponding to the density seen on the prior study. A
bony fracture fragment is less likely. No donor site or obvious
fracture identified. A 5 mm linear density in the superficial soft
tissues of the plantar aspect of foot is again noted laterally
similar to prior study. There is no dislocation or subluxation. No
soft tissue gas identified.
IMPRESSION: No definite acute fracture or dislocation.

Small foreign object in the soft tissues of the foot as seen on the
prior study.

## 2016-10-03 DEATH — deceased

## 2016-10-26 NOTE — Patient Outreach (Signed)
[facility]) Care Management  10/26/2016  Alan PottersLeo J Bryant 06/14/1961 [id number]   Patient outreach attempted to obtain mRS and confirmed deceased via obituary with name and DOB, and place of residence included to confirm.   mRS = 6  Sherle PoeNicole Janssen Zee, Conrad BurlingtonB.A.  [facility] Care Management Assistant

## 2016-12-12 IMAGING — CT CT HEAD W/O CM
3 of 4 series · 13 of 47 positions shown, 15 images · non-contrast
Comparison: CT head 07/17/2016

CLINICAL DATA: Stroke. 24 hours post tPA. Left-sided weakness and
slurred speech, improved

EXAM:
CT HEAD WITHOUT CONTRAST
TECHNIQUE: Contiguous axial images were obtained from the base of the skull
through the vertex without intravenous contrast.

[Series 2: head without · axial · non-contrast · 0.47mm/px · z∈[+1401,+1521]mm · 7 of 32 slices shown, 9 images]
[im 4/32  brain]
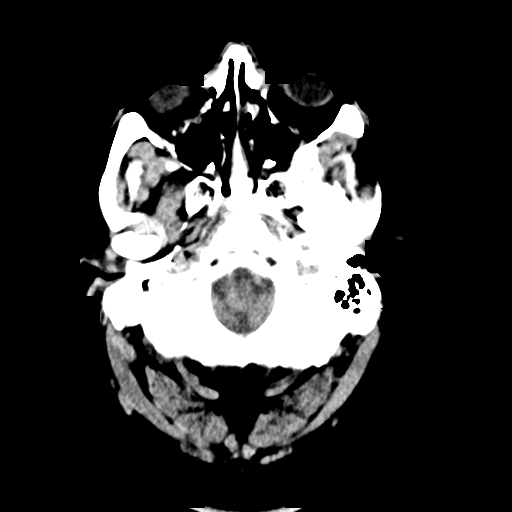
[im 4/32  bone]
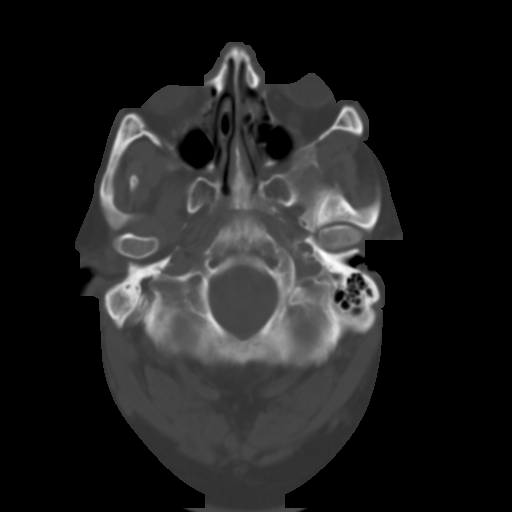
[im 8/32  brain]
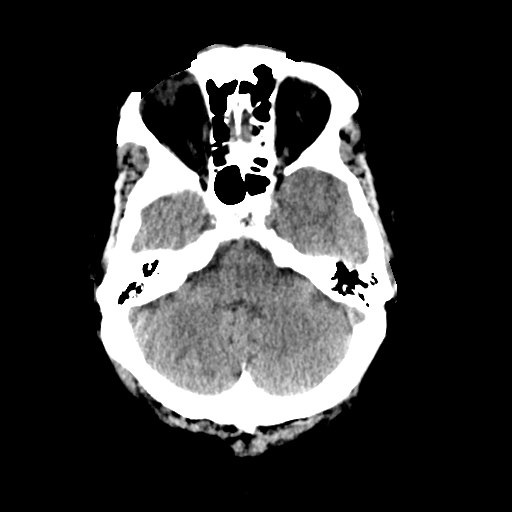
[im 12/32  brain]
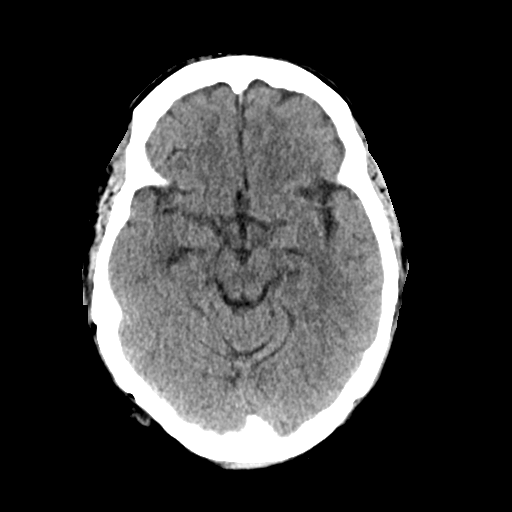
[im 16/32  brain]
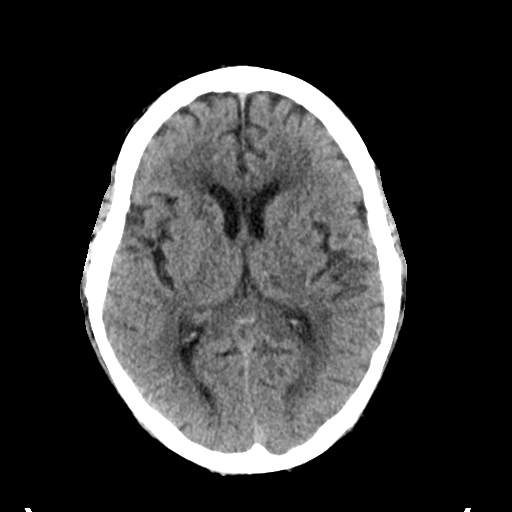
[im 20/32  brain]
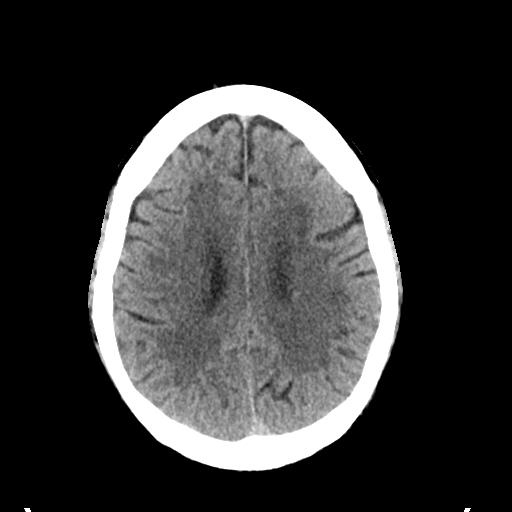
[im 20/32  bone]
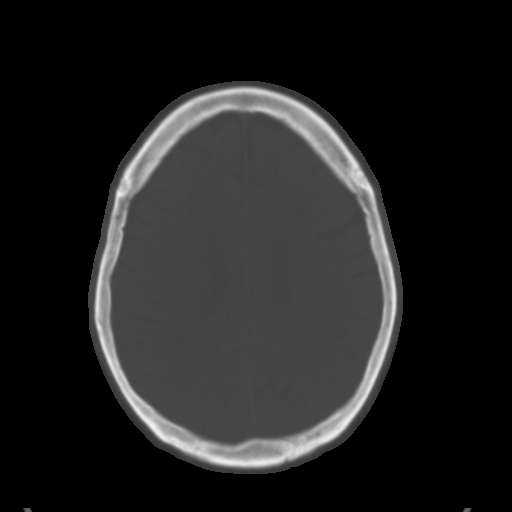
[im 24/32  brain]
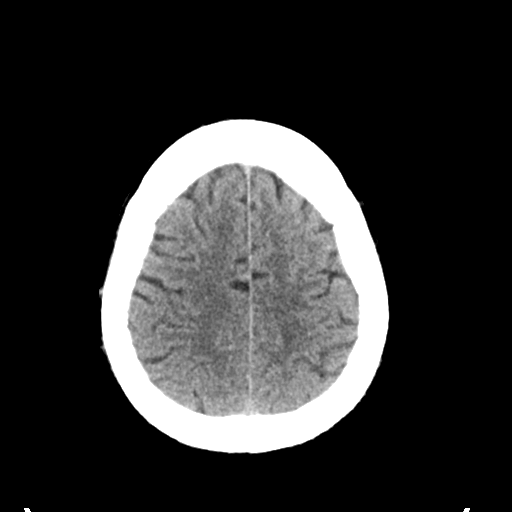
[im 28/32  brain]
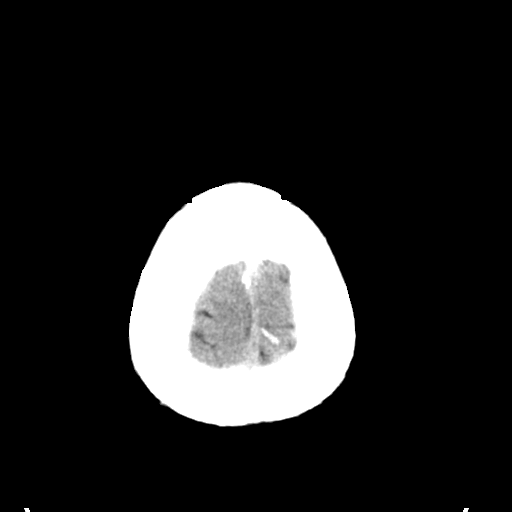

[Series 4: head without cor · coronal · non-contrast · 0.31mm/px · 3 of 70 slices shown]
[im 24/70  brain]
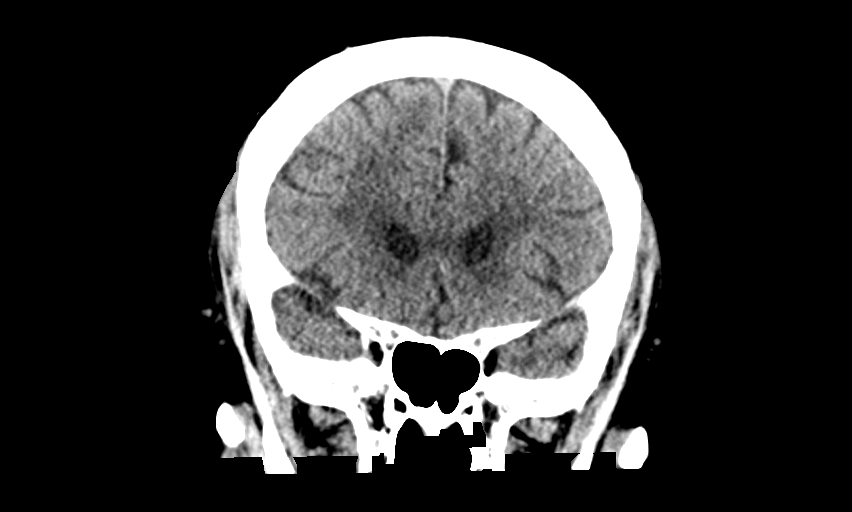
[im 31/70  brain]
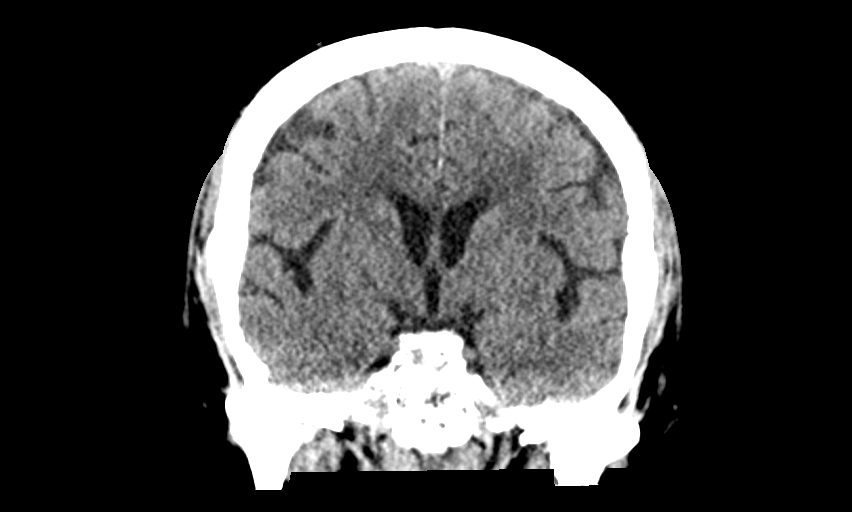
[im 39/70  brain]
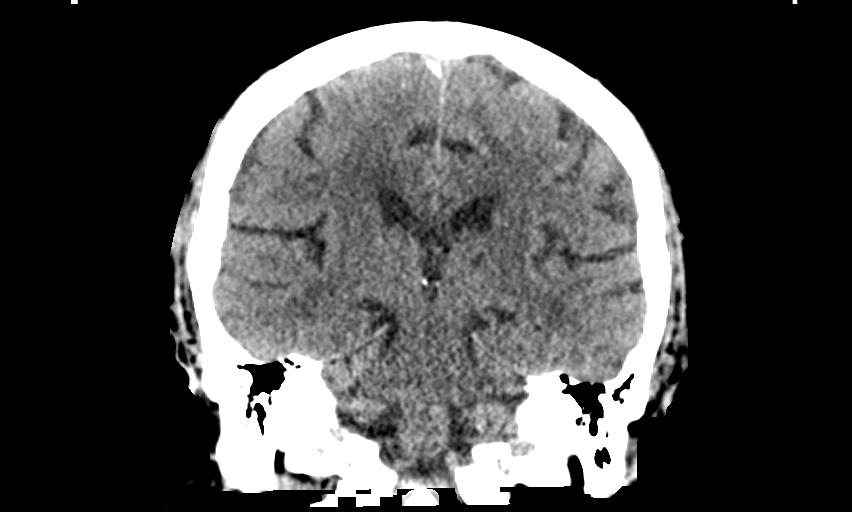

[Series 5: head without sag · sagittal · non-contrast · 0.31mm/px · 3 of 67 slices shown]
[im 23/67  brain]
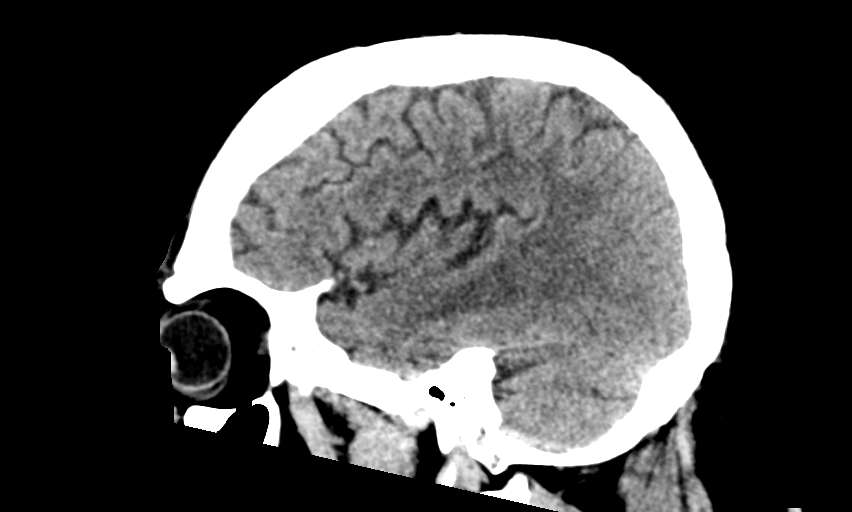
[im 34/67  brain]
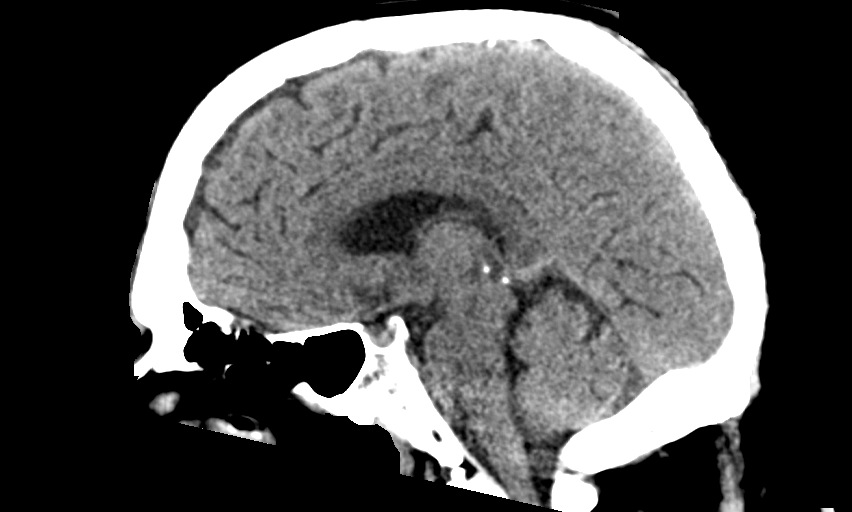
[im 45/67  brain]
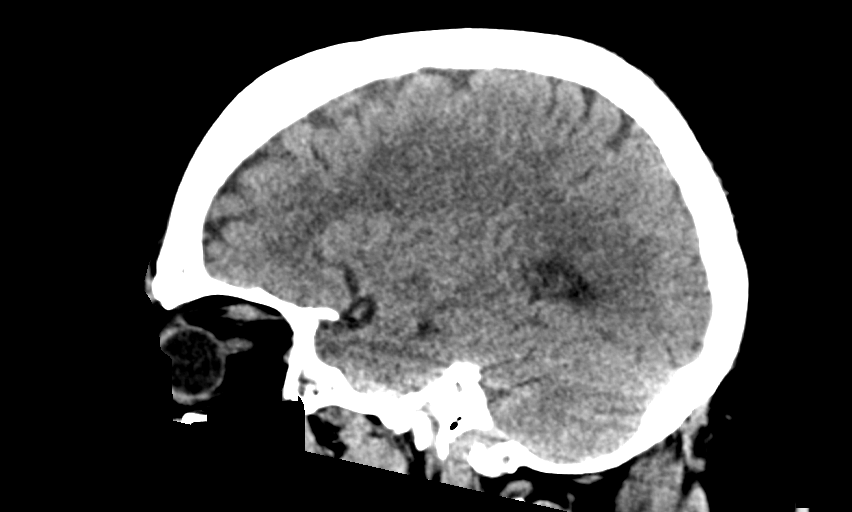

[13 of 47 positions shown; findings below may reference images not displayed]

FINDINGS: Brain: Mild atrophy. Negative for hydrocephalus. Chronic
microvascular ischemic changes in the white matter. Small chronic
infarct right cerebellum unchanged. Chronic infarct left thalamus
unchanged.

Negative for acute ischemic infarct. Negative for hemorrhage or
mass. No shift of the midline structures.

Vascular: Mild atherosclerotic calcification in the carotid
arteries. Negative for dense MCA.

Skull: Negative

Sinuses/Orbits: Mild mucosal edema paranasal sinuses.  Normal orbit.

Other: Negative
IMPRESSION: Atrophy and chronic ischemic changes are stable. No acute infarct or
hemorrhage.
